# Patient Record
Sex: Female | Born: 1960 | Race: White | Hispanic: No | Marital: Married | Smoking: Former smoker
Health system: Southern US, Community
[De-identification: ages and names within clinical notes are randomized; demographics above are authoritative.]

## PROBLEM LIST (undated history)

## (undated) DIAGNOSIS — K8020 Calculus of gallbladder without cholecystitis without obstruction: Secondary | ICD-10-CM

## (undated) DIAGNOSIS — K219 Gastro-esophageal reflux disease without esophagitis: Secondary | ICD-10-CM

## (undated) HISTORY — PX: TUBAL LIGATION: SHX77

## (undated) HISTORY — PX: BREAST SURGERY: SHX581

---

## 1997-09-13 ENCOUNTER — Emergency Department (HOSPITAL_COMMUNITY): Admission: EM | Admit: 1997-09-13 | Discharge: 1997-09-13 | Payer: Self-pay | Admitting: Emergency Medicine

## 2005-01-26 ENCOUNTER — Emergency Department (HOSPITAL_COMMUNITY): Admission: EM | Admit: 2005-01-26 | Discharge: 2005-01-26 | Payer: Self-pay | Admitting: Emergency Medicine

## 2006-11-23 ENCOUNTER — Emergency Department (HOSPITAL_COMMUNITY): Admission: EM | Admit: 2006-11-23 | Discharge: 2006-11-23 | Payer: Self-pay | Admitting: Emergency Medicine

## 2008-08-31 ENCOUNTER — Encounter: Admission: RE | Admit: 2008-08-31 | Discharge: 2008-08-31 | Payer: Self-pay | Admitting: Obstetrics and Gynecology

## 2011-03-30 LAB — URINALYSIS, ROUTINE W REFLEX MICROSCOPIC: Hgb urine dipstick: NEGATIVE

## 2011-10-30 ENCOUNTER — Emergency Department (HOSPITAL_COMMUNITY): Payer: Self-pay

## 2011-10-30 ENCOUNTER — Emergency Department (HOSPITAL_COMMUNITY)
Admission: EM | Admit: 2011-10-30 | Discharge: 2011-10-30 | Disposition: A | Discharge status: Home / Self Care | Payer: Self-pay | Attending: Emergency Medicine | Admitting: Emergency Medicine

## 2011-10-30 ENCOUNTER — Encounter (HOSPITAL_COMMUNITY): Payer: Self-pay | Admitting: *Deleted

## 2011-10-30 DIAGNOSIS — K8020 Calculus of gallbladder without cholecystitis without obstruction: Secondary | ICD-10-CM | POA: Insufficient documentation

## 2011-10-30 DIAGNOSIS — F17200 Nicotine dependence, unspecified, uncomplicated: Secondary | ICD-10-CM | POA: Insufficient documentation

## 2011-10-30 DIAGNOSIS — R1011 Right upper quadrant pain: Secondary | ICD-10-CM | POA: Insufficient documentation

## 2011-10-30 DIAGNOSIS — K8050 Calculus of bile duct without cholangitis or cholecystitis without obstruction: Secondary | ICD-10-CM

## 2011-10-30 LAB — CBC
MCH: 32.7 pg (ref 26.0–34.0)
MCHC: 34.5 g/dL (ref 30.0–36.0)
MCV: 94.7 fL (ref 78.0–100.0)
Platelets: 205 10*3/uL (ref 150–400)
RDW: 14 % (ref 11.5–15.5)
WBC: 13.3 10*3/uL — ABNORMAL HIGH (ref 4.0–10.5)

## 2011-10-30 LAB — URINALYSIS, ROUTINE W REFLEX MICROSCOPIC
Bilirubin Urine: NEGATIVE
Glucose, UA: NEGATIVE mg/dL
Leukocytes, UA: NEGATIVE
Urobilinogen, UA: 0.2 mg/dL (ref 0.0–1.0)

## 2011-10-30 LAB — DIFFERENTIAL
Basophils Relative: 0 % (ref 0–1)
Eosinophils Absolute: 0.1 10*3/uL (ref 0.0–0.7)
Lymphs Abs: 2.5 10*3/uL (ref 0.7–4.0)
Monocytes Relative: 4 % (ref 3–12)
Neutrophils Relative %: 76 % (ref 43–77)

## 2011-10-30 LAB — COMPREHENSIVE METABOLIC PANEL
ALT: 22 U/L (ref 0–35)
AST: 19 U/L (ref 0–37)
Albumin: 4.1 g/dL (ref 3.5–5.2)
BUN: 10 mg/dL (ref 6–23)
Chloride: 103 mEq/L (ref 96–112)
Creatinine, Ser: 0.82 mg/dL (ref 0.50–1.10)
GFR calc Af Amer: >90 mL/min (ref >90)
Potassium: 4.1 mEq/L (ref 3.5–5.1)
Sodium: 137 mEq/L (ref 135–145)
Total Protein: 7.6 g/dL (ref 6.0–8.3)

## 2011-10-30 MED ORDER — HYDROMORPHONE HCL PF 1 MG/ML IJ SOLN
1.0000 mg | Freq: Once | INTRAMUSCULAR | Status: AC
  Administered 2011-10-30: 1 mg via INTRAVENOUS
  Filled 2011-10-30: qty 1

## 2011-10-30 MED ORDER — TRAMADOL HCL 50 MG PO TABS: 50.0000 mg | ORAL_TABLET | Freq: Four times a day (QID) | ORAL | Status: AC | PRN

## 2011-10-30 MED ORDER — SODIUM CHLORIDE 0.9 % IV SOLN
Freq: Once | INTRAVENOUS | Status: AC
  Administered 2011-10-30: 11:00:00 via INTRAVENOUS

## 2011-10-30 MED ORDER — OXYCODONE-ACETAMINOPHEN 5-325 MG PO TABS: 1.0000 | ORAL_TABLET | ORAL | Status: DC | PRN

## 2011-10-30 MED ORDER — ONDANSETRON HCL 4 MG/2ML IJ SOLN
4.0000 mg | Freq: Once | INTRAMUSCULAR | Status: AC
  Administered 2011-10-30: 4 mg via INTRAVENOUS
  Filled 2011-10-30: qty 2

## 2011-10-30 NOTE — ED Notes (Signed)
To ED for eval of RUQ pain with radiation to shoulder. Pt states she has had dull pains for the past yr but this pain is significantly increased. No vomiting.

## 2011-10-30 NOTE — ED Notes (Signed)
Patient states onset one year ago and intermittent having upper middle abdomen pain radiating to RUQ and middle back radiating up to upper back.  Pain worsening recently this AM after waking up abdomen distended moderate firm with nausea.  Did not eat this AM Pain 7-10/10 achy cramping full feeling. Airway intact bilateral equal chest rise and fall.

## 2011-10-30 NOTE — ED Notes (Signed)
Pt transported to ultrasound.

## 2011-10-30 NOTE — ED Provider Notes (Signed)
History     CSN: [id number]  Arrival date & time 10/30/11  4098   First MD Initiated Contact with Patient 10/30/11 1007      Chief Complaint  Patient presents with  . Abdominal Pain    (Consider location/radiation/quality/duration/timing/severity/associated sxs/prior treatment) Patient is a 51 y.o. female presenting with abdominal pain. The history is provided by the patient.  Abdominal Pain The primary symptoms of the illness include abdominal pain.  She has been having a vague tight feeling in the upper abdomen for the last year. This morning, she developed severe right upper pain which radiated around to the right flank and right scapular area. There is no associated nausea, vomiting, diarrhea. She has not had any dysuria. Nothing makes the pain better nothing makes it worse. She rates the pain at 10/10. She has not taken any medication to try and help her pain.  History reviewed. No pertinent past medical history.  History reviewed. No pertinent past surgical history.  History reviewed. No pertinent family history.  History  Substance Use Topics  . Smoking status: Current Everyday Smoker -- 2.0 packs/day    Types: Cigarettes  . Smokeless tobacco: Not on file  . Alcohol Use: No    OB History    Grav Para Term Preterm Abortions TAB SAB Ect Mult Living                  Review of Systems  Gastrointestinal: Positive for abdominal pain.  All other systems reviewed and are negative.    Allergies  Hydrocodone and Oxycodone  Home Medications   Current Outpatient Rx  Name Route Sig Dispense Refill  . IBUPROFEN 200 MG PO TABS Oral Take 800 mg by mouth every 6 (six) hours as needed. For pain (headache)    . NAPROXEN SODIUM 220 MG PO TABS Oral Take 440 mg by mouth daily as needed. For pain (back pain)      BP 137/82  Pulse 72  Temp(Src) 98.3 F (36.8 C) (Oral)  Resp 20  SpO2 97%  Physical Exam  Nursing note and vitals reviewed.  51 year old female is resting  comfortably and in no acute distress. Vital signs are normal. Oxygen saturation is 97% which is normal. Head is normocephalic and atraumatic. PERRLA, EOMI. There is no scleral icterus. Oropharynx is clear. Neck is nontender and supple. Back is nontender. There's no CVA tenderness. Lungs are clear without rales, wheezes, rhonchi. Heart has regular rate rhythm without murmur. Abdomen is soft, flat, with moderate to severe right upper quadrant tenderness with a positive Murphy sign. There is minimal epigastric tenderness. There is no right lower quadrant tenderness. Peristalsis is decreased but present. Extremities have full range of motion, no cyanosis or edema. Skin is warm and dry without rash. Neurologic: Mental status is normal, cranial nerves are intact, there are no focal motor or sensory deficits.  ED Course  Procedures (including critical care time)  Results for orders placed during the hospital encounter of 10/30/11  CBC      Component Value Range   WBC 13.3 (*) 4.0 - 10.5 (K/uL)   RBC 4.93  3.87 - 5.11 (MIL/uL)   Hemoglobin 16.1 (*) 12.0 - 15.0 (g/dL)   HCT 11.9 (*) 14.7 - 46.0 (%)   MCV 94.7  78.0 - 100.0 (fL)   MCH 32.7  26.0 - 34.0 (pg)   MCHC 34.5  30.0 - 36.0 (g/dL)   RDW 82.9  56.2 - 13.0 (%)   Platelets 205  150 -  400 (K/uL)  DIFFERENTIAL      Component Value Range   Neutrophils Relative 76  43 - 77 (%)   Lymphocytes Relative 19  12 - 46 (%)   Monocytes Relative 4  3 - 12 (%)   Eosinophils Relative 1  0 - 5 (%)   Basophils Relative 0  0 - 1 (%)   Neutro Abs 10.2 (*) 1.7 - 7.7 (K/uL)   Lymphs Abs 2.5  0.7 - 4.0 (K/uL)   Monocytes Absolute 0.5  0.1 - 1.0 (K/uL)   Eosinophils Absolute 0.1  0.0 - 0.7 (K/uL)   Basophils Absolute 0.0  0.0 - 0.1 (K/uL)  COMPREHENSIVE METABOLIC PANEL      Component Value Range   Sodium 137  135 - 145 (mEq/L)   Potassium 4.1  3.5 - 5.1 (mEq/L)   Chloride 103  96 - 112 (mEq/L)   CO2 22  19 - 32 (mEq/L)   Glucose, Bld 91  70 - 99 (mg/dL)   BUN  10  6 - 23 (mg/dL)   Creatinine, Ser 1.61  0.50 - 1.10 (mg/dL)   Calcium 9.8  8.4 - 09.6 (mg/dL)   Total Protein 7.6  6.0 - 8.3 (g/dL)   Albumin 4.1  3.5 - 5.2 (g/dL)   AST 19  0 - 37 (U/L)   ALT 22  0 - 35 (U/L)   Alkaline Phosphatase 97  39 - 117 (U/L)   Total Bilirubin 0.3  0.3 - 1.2 (mg/dL)   GFR calc non Af Amer 83 (*) >90 (mL/min)   GFR calc Af Amer >90  >90 (mL/min)  LIPASE, BLOOD      Component Value Range   Lipase 32  11 - 59 (U/L)  URINALYSIS, ROUTINE W REFLEX MICROSCOPIC      Component Value Range   Color, Urine YELLOW  YELLOW    APPearance CLEAR  CLEAR    Specific Gravity, Urine 1.008  1.005 - 1.030    pH 5.0  5.0 - 8.0    Glucose, UA NEGATIVE  NEGATIVE (mg/dL)   Hgb urine dipstick NEGATIVE  NEGATIVE    Bilirubin Urine NEGATIVE  NEGATIVE    Ketones, ur NEGATIVE  NEGATIVE (mg/dL)   Protein, ur NEGATIVE  NEGATIVE (mg/dL)   Urobilinogen, UA 0.2  0.0 - 1.0 (mg/dL)   Nitrite NEGATIVE  NEGATIVE    Leukocytes, UA NEGATIVE  NEGATIVE   POCT PREGNANCY, URINE      Component Value Range   Preg Test, Ur NEGATIVE  NEGATIVE    US Abdomen Complete  10/30/2011  *RADIOLOGY REPORT*  Clinical Data:  Abdominal pain.  COMPLETE ABDOMINAL ULTRASOUND  Comparison:  None  Findings:  Gallbladder:  Multiple echogenic gallstones in the gallbladder with associated acoustic shadowing.  The largest stone measures approximately 16 mm.  No gallbladder wall thickening or pericholecystic fluid.  Equivocal sonographic Murphy's sign.  Common bile duct:  Mildly dilated at 7.5 mm.  Liver:  The liver is sonographically unremarkable.  There is normal echogenicity without focal lesions or intrahepatic biliary dilatation.  IVC:  Normal caliber.  Pancreas:  Sonographically normal.  Spleen:  Normal size and echogenicity without focal lesions.  Right Kidney:  9.8 cm in length. Normal renal cortical thickness and echogenicity without focal lesions or hydronephrosis.  Left Kidney:  9.9 cm in length. Normal renal cortical  thickness and echogenicity without focal lesions or hydronephrosis.  Abdominal aorta:  Normal caliber.  IMPRESSION:  1.  Cholelithiasis without gallbladder wall thickening or pericholecystic  fluid.  Equivocal sonographic Murphy's sign. 2.  Mildly dilated common bile duct at 7.5 mm. 3.  The remainder of the examination is unremarkable.  Original Report Authenticated By: P. Loralie Champagne, M.D.      1. Biliary colic       MDM  Right upper quadrant pain suspicious for biliary colic. Ultrasound will be obtained. She will be given narcotics for pain control.  She got good relief of pain with hydromorphone. Ultrasound shows evidence of cholelithiasis but transaminases are normal. I feel she can safely go home at this point with referral to general surgery. She's a vice in a low-fat diet and a prescription was given for Percocet for pain.      Dione Booze, MD 10/30/11 [phone]

## 2011-10-30 NOTE — Discharge Instructions (Signed)
Your ultrasound shows that there are stones in her gallbladder and this is what was causing your pain. You need to make an appointment with the surgeon to have your gallbladder taken out. Otherwise, he will have additional episodes of pain like this. In the meantime, try to stay on a low-fat diet. Return to the emergency department if pain is not being adequately controlled at home.  Biliary Colic  Biliary colic is a steady or irregular pain in the upper abdomen. It is usually under the right side of the rib cage. It happens when gallstones interfere with the normal flow of bile from the gallbladder. Bile is a liquid that helps to digest fats. Bile is made in the liver and stored in the gallbladder. When you eat a meal, bile passes from the gallbladder through the cystic duct and the common bile duct into the small intestine. There, it mixes with partially digested food. If a gallstone blocks either of these ducts, the normal flow of bile is blocked. The muscle cells in the bile duct contract forcefully to try to move the stone. This causes the pain of biliary colic.  SYMPTOMS   A person with biliary colic usually complains of pain in the upper abdomen. This pain can be:   In the center of the upper abdomen just below the breastbone.   In the upper-right part of the abdomen, near the gallbladder and liver.   Spread back toward the right shoulder blade.   Nausea and vomiting.   The pain usually occurs after eating.   Biliary colic is usually triggered by the digestive system's demand for bile. The demand for bile is high after fatty meals. Symptoms can also occur when a person who has been fasting suddenly eats a very large meal. Most episodes of biliary colic pass after 1 to 5 hours. After the most intense pain passes, your abdomen may continue to ache mildly for about 24 hours.  DIAGNOSIS  After you describe your symptoms, your caregiver will perform a physical exam. He or she will pay attention  to the upper right portion of your belly (abdomen). This is the area of your liver and gallbladder. An ultrasound will help your caregiver look for gallstones. Specialized scans of the gallbladder may also be done. Blood tests may be done, especially if you have fever or if your pain persists. PREVENTION  Biliary colic can be prevented by controlling the risk factors for gallstones. Some of these risk factors, such as heredity, increasing age, and pregnancy are a normal part of life. Obesity and a high-fat diet are risk factors you can change through a healthy lifestyle. Women going through menopause who take hormone replacement therapy (estrogen) are also more likely to develop biliary colic. TREATMENT   Pain medication may be prescribed.   You may be encouraged to eat a fat-free diet.   If the first episode of biliary colic is severe, or episodes of colic keep retuning, surgery to remove the gallbladder (cholecystectomy) is usually recommended. This procedure can be done through small incisions using an instrument called a laparoscope. The procedure often requires a brief stay in the hospital. Some people can leave the hospital the same day. It is the most widely used treatment in people troubled by painful gallstones. It is effective and safe, with no complications in more than 90% of cases.   If surgery cannot be done, medication that dissolves gallstones may be used. This medication is expensive and can take months or years  to work. Only small stones will dissolve.   Rarely, medication to dissolve gallstones is combined with a procedure called shock-wave lithotripsy. This procedure uses carefully aimed shock waves to break up gallstones. In many people treated with this procedure, gallstones form again within a few years.  PROGNOSIS  If gallstones block your cystic duct or common bile duct, you are at risk for repeated episodes of biliary colic. There is also a 25% chance that you will develop a  gallbladder infection(acute cholecystitis), or some other complication of gallstones within 10 to 20 years. If you have surgery, schedule it at a time that is convenient for you and at a time when you are not sick. HOME CARE INSTRUCTIONS   Drink plenty of clear fluids.   Avoid fatty, greasy or fried foods, or any foods that make your pain worse.   Take medications as directed.  SEEK MEDICAL CARE IF:   You develop a fever over 100.5 F (38.1 C).   Your pain gets worse over time.   You develop nausea that prevents you from eating and drinking.   You develop vomiting.  SEEK IMMEDIATE MEDICAL CARE IF:   You have continuous or severe belly (abdominal) pain which is not relieved with medications.   You develop nausea and vomiting which is not relieved with medications.   You have symptoms of biliary colic and you suddenly develop a fever and shaking chills. This may signal cholecystitis. Call your caregiver immediately.   You develop a yellow color to your skin or the white part of your eyes (jaundice).  Document Released: 10/30/2005 Document Revised: 05/18/2011 Document Reviewed: 01/09/2008 [facility] Patient Information 2012 [place], [place].  Acetaminophen; Oxycodone tablets What is this medicine? ACETAMINOPHEN; OXYCODONE (a set a MEE noe fen; ox i KOE done) is a pain reliever. It is used to treat mild to moderate pain. This medicine may be used for other purposes; ask your health care provider or pharmacist if you have questions. What should I tell my health care provider before I take this medicine? They need to know if you have any of these conditions: -brain tumor -Crohn's disease, inflammatory bowel disease, or ulcerative colitis -drink more than 3 alcohol containing drinks per day -drug abuse or addiction -head injury -heart or circulation problems -kidney disease or problems going to the bathroom -liver disease -lung disease, asthma, or breathing problems -an unusual or  allergic reaction to acetaminophen, oxycodone, other opioid analgesics, other medicines, foods, dyes, or preservatives -pregnant or trying to get pregnant -breast-feeding How should I use this medicine? Take this medicine by mouth with a full glass of water. Follow the directions on the prescription label. Take your medicine at regular intervals. Do not take your medicine more often than directed. Talk to your pediatrician regarding the use of this medicine in children. Special care may be needed. Patients over 23 years old may have a stronger reaction and need a smaller dose. Overdosage: If you think you have taken too much of this medicine contact a poison control center or emergency room at once. NOTE: This medicine is only for you. Do not share this medicine with others. What if I miss a dose? If you miss a dose, take it as soon as you can. If it is almost time for your next dose, take only that dose. Do not take double or extra doses. What may interact with this medicine? -alcohol or medicines that contain alcohol -antihistamines -barbiturates like amobarbital, butalbital, butabarbital, methohexital, pentobarbital, phenobarbital, thiopental, and  secobarbital -benztropine -drugs for bladder problems like solifenacin, trospium, oxybutynin, tolterodine, hyoscyamine, and methscopolamine -drugs for breathing problems like ipratropium and tiotropium -drugs for certain stomach or intestine problems like propantheline, homatropine methylbromide, glycopyrrolate, atropine, belladonna, and dicyclomine -general anesthetics like etomidate, ketamine, nitrous oxide, propofol, desflurane, enflurane, halothane, isoflurane, and sevoflurane -medicines for depression, anxiety, or psychotic disturbances -medicines for pain like codeine, morphine, pentazocine, buprenorphine, butorphanol, nalbuphine, tramadol, and propoxyphene -medicines for sleep -muscle relaxants -naltrexone -phenothiazines like  perphenazine, thioridazine, chlorpromazine, mesoridazine, fluphenazine, prochlorperazine, promazine, and trifluoperazine -scopolamine -trihexyphenidyl This list may not describe all possible interactions. Give your health care provider a list of all the medicines, herbs, non-prescription drugs, or dietary supplements you use. Also tell them if you smoke, drink alcohol, or use illegal drugs. Some items may interact with your medicine. What should I watch for while using this medicine? Tell your doctor or health care professional if your pain does not go away, if it gets worse, or if you have new or a different type of pain. You may develop tolerance to the medicine. Tolerance means that you will need a higher dose of the medication for pain relief. Tolerance is normal and is expected if you take this medicine for a long time. Do not suddenly stop taking your medicine because you may develop a severe reaction. Your body becomes used to the medicine. This does NOT mean you are addicted. Addiction is a behavior related to getting and using a drug for a nonmedical reason. If you have pain, you have a medical reason to take pain medicine. Your doctor will tell you how much medicine to take. If your doctor wants you to stop the medicine, the dose will be slowly lowered over time to avoid any side effects. You may get drowsy or dizzy. Do not drive, use machinery, or do anything that needs mental alertness until you know how this medicine affects you. Do not stand or sit up quickly, especially if you are an older patient. This reduces the risk of dizzy or fainting spells. Alcohol may interfere with the effect of this medicine. Avoid alcoholic drinks. The medicine will cause constipation. Try to have a bowel movement at least every 2 to 3 days. If you do not have a bowel movement for 3 days, call your doctor or health care professional. Do not take Tylenol (acetaminophen) or medicines that have acetaminophen with  this medicine. Too much acetaminophen can be very dangerous. Many nonprescription medicines contain acetaminophen. Always read the labels carefully to avoid taking more acetaminophen. What side effects may I notice from receiving this medicine? Side effects that you should report to your doctor or health care professional as soon as possible: -allergic reactions like skin rash, itching or hives, swelling of the face, lips, or tongue -breathing difficulties, wheezing -confusion -light headedness or fainting spells -severe stomach pain -yellowing of the skin or the whites of the eyes Side effects that usually do not require medical attention (report to your doctor or health care professional if they continue or are bothersome): -dizziness -drowsiness -nausea -vomiting This list may not describe all possible side effects. Call your doctor for medical advice about side effects. You may report side effects to FDA at 1-800-FDA-1088. Where should I keep my medicine? Keep out of the reach of children. This medicine can be abused. Keep your medicine in a safe place to protect it from theft. Do not share this medicine with anyone. Selling or giving away this medicine is dangerous and against the  law. Store at room temperature between 20 and 25 degrees C (68 and 77 degrees F). Keep container tightly closed. Protect from light. Flush any unused medicines down the toilet. Do not use the medicine after the expiration date. NOTE: This sheet is a summary. It may not cover all possible information. If you have questions about this medicine, talk to your doctor, pharmacist, or health care provider.  2012, [employer]. (04/27/2008 10:01:21 AM)

## 2011-11-09 ENCOUNTER — Ambulatory Visit (INDEPENDENT_AMBULATORY_CARE_PROVIDER_SITE_OTHER): Payer: Self-pay | Admitting: Surgery

## 2013-05-02 ENCOUNTER — Ambulatory Visit (INDEPENDENT_AMBULATORY_CARE_PROVIDER_SITE_OTHER): Payer: Self-pay | Admitting: General Surgery

## 2013-05-02 ENCOUNTER — Ambulatory Visit (INDEPENDENT_AMBULATORY_CARE_PROVIDER_SITE_OTHER): Payer: Self-pay | Admitting: Surgery

## 2013-05-06 ENCOUNTER — Encounter (INDEPENDENT_AMBULATORY_CARE_PROVIDER_SITE_OTHER): Payer: Self-pay | Admitting: General Surgery

## 2013-05-06 ENCOUNTER — Ambulatory Visit (INDEPENDENT_AMBULATORY_CARE_PROVIDER_SITE_OTHER): Payer: BC Managed Care – PPO | Admitting: General Surgery

## 2013-05-06 VITALS — BP 120/72 | HR 100 | Temp 98.2°F | Resp 14 | Ht 70.0 in | Wt 191.0 lb

## 2013-05-06 DIAGNOSIS — K8020 Calculus of gallbladder without cholecystitis without obstruction: Secondary | ICD-10-CM

## 2013-05-06 NOTE — Progress Notes (Signed)
Patient ID: April Pennington, female   DOB: 07-31-1960, 52 y.o.   MRN: [id number]  Chief Complaint  Patient presents with  . Cholelithiasis    HPI April Pennington is a 52 y.o. female.  The patient is a 52 year old female who presents today for evaluation of gallstones. The patient had one half year history of right upper quadrant pain. She presented to the ER for evaluation of the pain. She underwent ultrasound which revealed gallstones. Her LFTs were within normal limits there this time.  The patient states her pain is completely at this time a consistent right upper quadrant pain. She states that she has pain with without meals.Marland Kitchen  HPI  No past medical history on file.  No past surgical history on file.  No family history on file.  Social History History  Substance Use Topics  . Smoking status: Former Smoker -- 2.00 packs/day    Types: Cigarettes    Quit date: 02/11/2012  . Smokeless tobacco: Not on file  . Alcohol Use: No    Allergies  Allergen Reactions  . Hydrocodone Nausea And Vomiting  . Oxycodone Nausea And Vomiting    Current Outpatient Prescriptions  Medication Sig Dispense Refill  . aspirin-sod bicarb-citric acid (ALKA-SELTZER) 325 MG TBEF tablet Take 325 mg by mouth every 6 (six) hours as needed.      Marland Kitchen ibuprofen (ADVIL,MOTRIN) 200 MG tablet Take 800 mg by mouth every 6 (six) hours as needed. For pain (headache)       No current facility-administered medications for this visit.    Review of Systems Review of Systems  Constitutional: Negative.   HENT: Negative.   Respiratory: Negative.   Cardiovascular: Negative.   Gastrointestinal: Negative.   Hematological: Negative.   All other systems reviewed and are negative.    Blood pressure 120/72, pulse 100, temperature 98.2 F (36.8 C), temperature source Temporal, resp. rate 14, height 5\' 10"  (1.778 m), weight 191 lb (86.637 kg).  Physical Exam Physical Exam  Constitutional: She is oriented to person, place,  and time. She appears well-developed and well-nourished.  HENT:  Head: Normocephalic and atraumatic.  Eyes: Conjunctivae and EOM are normal. Pupils are equal, round, and reactive to light.  Neck: Normal range of motion. Neck supple.  Cardiovascular: Normal rate, regular rhythm and normal heart sounds.   Pulmonary/Chest: Effort normal and breath sounds normal.  Abdominal: Soft. Bowel sounds are normal. There is no tenderness. There is no rebound.  Musculoskeletal: Normal range of motion.  Neurological: She is alert and oriented to person, place, and time.  Skin: Skin is warm and dry.    Data Reviewed Ultrasound reveals gallstones  Assessment    52 year old female with symptomatic cholelithiasis     Plan    1. We'll proceed to the operating room for laparoscopic cholecystectomy with IOC 2. All risks and benefits were discussed with the patient to generally include: infection, bleeding, possible need for post op ERCP, damage to the bile ducts, and bile leak. Alternatives were offered and described.  All questions were answered and the patient voiced understanding of the procedure and wishes to proceed at this point with a laparoscopic cholecystectomy.         Marigene Ehlers., Callan Norden 05/06/2013, 3:58 PM

## 2013-05-07 ENCOUNTER — Telehealth (INDEPENDENT_AMBULATORY_CARE_PROVIDER_SITE_OTHER): Payer: Self-pay | Admitting: General Surgery

## 2013-05-07 NOTE — Telephone Encounter (Signed)
Surgery scheduling contacted patient about surgery, gave patient financial responsibility, patient will call back to schedule, face sheet placed in pending.

## 2013-05-19 ENCOUNTER — Encounter (HOSPITAL_COMMUNITY): Payer: Self-pay

## 2013-05-19 ENCOUNTER — Other Ambulatory Visit (HOSPITAL_COMMUNITY): Payer: Self-pay | Admitting: *Deleted

## 2013-05-19 ENCOUNTER — Encounter (HOSPITAL_COMMUNITY)
Admission: RE | Admit: 2013-05-19 | Discharge: 2013-05-19 | Disposition: A | Discharge status: Home / Self Care | Payer: BC Managed Care – PPO | Source: Ambulatory Visit | Attending: General Surgery | Admitting: General Surgery

## 2013-05-19 HISTORY — DX: Gastro-esophageal reflux disease without esophagitis: K21.9

## 2013-05-19 HISTORY — DX: Calculus of gallbladder without cholecystitis without obstruction: K80.20

## 2013-05-19 LAB — BASIC METABOLIC PANEL
BUN: 15 mg/dL (ref 6–23)
Calcium: 9.3 mg/dL (ref 8.4–10.5)
Chloride: 105 mEq/L (ref 96–112)
Creatinine, Ser: 0.79 mg/dL (ref 0.50–1.10)
GFR calc Af Amer: >90 mL/min (ref >90)
GFR calc non Af Amer: >90 mL/min (ref >90)
Glucose, Bld: 90 mg/dL (ref 70–99)
Potassium: 4.1 mEq/L (ref 3.5–5.1)

## 2013-05-19 LAB — CBC
HCT: 43.3 % (ref 36.0–46.0)
MCHC: 34.4 g/dL (ref 30.0–36.0)
Platelets: 225 10*3/uL (ref 150–400)
RDW: 13.4 % (ref 11.5–15.5)

## 2013-05-19 MED ORDER — CEFAZOLIN SODIUM-DEXTROSE 2-3 GM-% IV SOLR
2.0000 g | INTRAVENOUS | Status: AC
  Administered 2013-05-20: 2 g via INTRAVENOUS
  Filled 2013-05-19: qty 50

## 2013-05-19 MED ORDER — CHLORHEXIDINE GLUCONATE 4 % EX LIQD: 1.0000 "application " | Freq: Once | CUTANEOUS | Status: DC

## 2013-05-19 NOTE — Pre-Procedure Instructions (Signed)
Jalisia Puchalski  05/19/2013   Your procedure is scheduled on:  Tuesday, May 20, 2013 at 7:30 AM.   Report to [facility] Entrance "A" Admitting Office at 5:30 AM.   Call this number if you have problems the morning of surgery: [phone]   Remember:   Do not eat food or drink liquids after midnight tonight, 05/19/13.   Take these medicines the morning of surgery with A SIP OF WATER: none   Do not wear jewelry, make-up or nail polish.  Do not wear lotions, powders, or perfumes. You may wear deodorant.  Do not shave 48 hours prior to surgery.   Do not bring valuables to the hospital.  [facility] is not responsible                  for any belongings or valuables.               Contacts, dentures or bridgework may not be worn into surgery.  Leave suitcase in the car. After surgery it may be brought to your room.  For patients admitted to the hospital, discharge time is determined by your                treatment team.               Patients discharged the day of surgery will not be allowed to drive  home.  Name and phone number of your driver: Family/friend   Special Instructions: Shower using CHG 2 nights before surgery and the night before surgery.  If you shower the day of surgery use CHG.  Use special wash - you have one bottle of CHG for all showers.  You should use approximately 1/3 of the bottle for each shower.   Please read over the following fact sheets that you were given: Pain Booklet, Coughing and Deep Breathing and Surgical Site Infection Prevention

## 2013-05-20 ENCOUNTER — Encounter (HOSPITAL_COMMUNITY): Payer: BC Managed Care – PPO | Admitting: Anesthesiology

## 2013-05-20 ENCOUNTER — Ambulatory Visit (HOSPITAL_COMMUNITY)
Admission: RE | Admit: 2013-05-20 | Discharge: 2013-05-20 | Disposition: A | Discharge status: Home / Self Care | Payer: BC Managed Care – PPO | Source: Ambulatory Visit | Attending: General Surgery | Admitting: General Surgery

## 2013-05-20 ENCOUNTER — Encounter (HOSPITAL_COMMUNITY): Payer: Self-pay | Admitting: Certified Registered"

## 2013-05-20 ENCOUNTER — Ambulatory Visit (HOSPITAL_COMMUNITY): Payer: BC Managed Care – PPO | Admitting: Anesthesiology

## 2013-05-20 ENCOUNTER — Ambulatory Visit (HOSPITAL_COMMUNITY): Payer: BC Managed Care – PPO

## 2013-05-20 ENCOUNTER — Encounter (HOSPITAL_COMMUNITY)
Admission: RE | Disposition: A | Discharge status: Home / Self Care | Payer: Self-pay | Source: Ambulatory Visit | Attending: General Surgery

## 2013-05-20 DIAGNOSIS — K219 Gastro-esophageal reflux disease without esophagitis: Secondary | ICD-10-CM | POA: Insufficient documentation

## 2013-05-20 DIAGNOSIS — K8010 Calculus of gallbladder with chronic cholecystitis without obstruction: Secondary | ICD-10-CM

## 2013-05-20 DIAGNOSIS — Z87891 Personal history of nicotine dependence: Secondary | ICD-10-CM | POA: Insufficient documentation

## 2013-05-20 HISTORY — PX: CHOLECYSTECTOMY: SHX55

## 2013-05-20 SURGERY — LAPAROSCOPIC CHOLECYSTECTOMY WITH INTRAOPERATIVE CHOLANGIOGRAM
Anesthesia: General | Site: Abdomen

## 2013-05-20 MED ORDER — TRAMADOL HCL 50 MG PO TABS: 50.0000 mg | ORAL_TABLET | Freq: Four times a day (QID) | ORAL | Status: DC | PRN

## 2013-05-20 MED ORDER — CEFAZOLIN SODIUM-DEXTROSE 2-3 GM-% IV SOLR
INTRAVENOUS | Status: AC
  Filled 2013-05-20: qty 50

## 2013-05-20 MED ORDER — LIDOCAINE HCL (CARDIAC) 20 MG/ML IV SOLN
INTRAVENOUS | Status: DC | PRN
  Administered 2013-05-20: 80 mg via INTRAVENOUS

## 2013-05-20 MED ORDER — ONDANSETRON HCL 4 MG/2ML IJ SOLN
INTRAMUSCULAR | Status: DC | PRN
  Administered 2013-05-20: 4 mg via INTRAVENOUS

## 2013-05-20 MED ORDER — PROPOFOL 10 MG/ML IV BOLUS
INTRAVENOUS | Status: DC | PRN
  Administered 2013-05-20: 160 mg via INTRAVENOUS

## 2013-05-20 MED ORDER — PROMETHAZINE HCL 25 MG/ML IJ SOLN
6.2500 mg | INTRAMUSCULAR | Status: DC | PRN
  Administered 2013-05-20: 6.25 mg via INTRAVENOUS

## 2013-05-20 MED ORDER — BUPIVACAINE HCL 0.25 % IJ SOLN
INTRAMUSCULAR | Status: DC | PRN
  Administered 2013-05-20: 7 mL

## 2013-05-20 MED ORDER — IOHEXOL 300 MG/ML  SOLN
INTRAMUSCULAR | Status: DC | PRN
  Administered 2013-05-20: 7 mL via INTRAVENOUS

## 2013-05-20 MED ORDER — KETOROLAC TROMETHAMINE 30 MG/ML IJ SOLN
INTRAMUSCULAR | Status: AC
  Filled 2013-05-20: qty 1

## 2013-05-20 MED ORDER — 0.9 % SODIUM CHLORIDE (POUR BTL) OPTIME
TOPICAL | Status: DC | PRN
  Administered 2013-05-20: 1000 mL

## 2013-05-20 MED ORDER — HYDROMORPHONE HCL PF 1 MG/ML IJ SOLN
INTRAMUSCULAR | Status: AC
  Filled 2013-05-20: qty 1

## 2013-05-20 MED ORDER — BUPIVACAINE HCL (PF) 0.25 % IJ SOLN
INTRAMUSCULAR | Status: AC
  Filled 2013-05-20: qty 30

## 2013-05-20 MED ORDER — MIDAZOLAM HCL 5 MG/5ML IJ SOLN
INTRAMUSCULAR | Status: DC | PRN
  Administered 2013-05-20: 2 mg via INTRAVENOUS

## 2013-05-20 MED ORDER — KETOROLAC TROMETHAMINE 15 MG/ML IJ SOLN
15.0000 mg | Freq: Four times a day (QID) | INTRAMUSCULAR | Status: DC
  Administered 2013-05-20: 15 mg via INTRAVENOUS

## 2013-05-20 MED ORDER — GLYCOPYRROLATE 0.2 MG/ML IJ SOLN
INTRAMUSCULAR | Status: DC | PRN
  Administered 2013-05-20: 0.6 mg via INTRAVENOUS

## 2013-05-20 MED ORDER — PROMETHAZINE HCL 25 MG/ML IJ SOLN
INTRAMUSCULAR | Status: AC
  Filled 2013-05-20: qty 1

## 2013-05-20 MED ORDER — NEOSTIGMINE METHYLSULFATE 1 MG/ML IJ SOLN
INTRAMUSCULAR | Status: DC | PRN
  Administered 2013-05-20: 4 mg via INTRAVENOUS

## 2013-05-20 MED ORDER — FENTANYL CITRATE 0.05 MG/ML IJ SOLN
INTRAMUSCULAR | Status: DC | PRN
  Administered 2013-05-20: 50 ug via INTRAVENOUS
  Administered 2013-05-20: 100 ug via INTRAVENOUS
  Administered 2013-05-20: 50 ug via INTRAVENOUS

## 2013-05-20 MED ORDER — EPHEDRINE SULFATE 50 MG/ML IJ SOLN
INTRAMUSCULAR | Status: DC | PRN
  Administered 2013-05-20: 15 mg via INTRAVENOUS

## 2013-05-20 MED ORDER — LACTATED RINGERS IV SOLN
INTRAVENOUS | Status: DC | PRN
  Administered 2013-05-20: 07:00:00 via INTRAVENOUS

## 2013-05-20 MED ORDER — DEXAMETHASONE SODIUM PHOSPHATE 4 MG/ML IJ SOLN
INTRAMUSCULAR | Status: DC | PRN
  Administered 2013-05-20: 4 mg via INTRAVENOUS

## 2013-05-20 MED ORDER — ARTIFICIAL TEARS OP OINT
TOPICAL_OINTMENT | OPHTHALMIC | Status: DC | PRN
  Administered 2013-05-20: 1 via OPHTHALMIC

## 2013-05-20 MED ORDER — HYDROMORPHONE HCL PF 1 MG/ML IJ SOLN
0.2500 mg | INTRAMUSCULAR | Status: DC | PRN
  Administered 2013-05-20: 0.5 mg via INTRAVENOUS

## 2013-05-20 MED ORDER — SODIUM CHLORIDE 0.9 % IR SOLN
Status: DC | PRN
  Administered 2013-05-20: 1000 mL

## 2013-05-20 MED ORDER — SODIUM CHLORIDE 0.9 % IJ SOLN
INTRAMUSCULAR | Status: AC
  Filled 2013-05-20: qty 9

## 2013-05-20 MED ORDER — ROCURONIUM BROMIDE 100 MG/10ML IV SOLN
INTRAVENOUS | Status: DC | PRN
  Administered 2013-05-20: 50 mg via INTRAVENOUS

## 2013-05-20 SURGICAL SUPPLY — 49 items
APL SKNCLS STERI-STRIP NONHPOA (GAUZE/BANDAGES/DRESSINGS) ×1
APPLIER CLIP 5 13 M/L LIGAMAX5 (MISCELLANEOUS) ×2
APR CLP MED LRG 5 ANG JAW (MISCELLANEOUS) ×1
BAG SPEC RTRVL LRG 6X4 10 (ENDOMECHANICALS)
BENZOIN TINCTURE PRP APPL 2/3 (GAUZE/BANDAGES/DRESSINGS) ×2 IMPLANT
CANISTER SUCTION 2500CC (MISCELLANEOUS) ×2 IMPLANT
CHLORAPREP W/TINT 26ML (MISCELLANEOUS) ×2 IMPLANT
CLIP APPLIE 5 13 M/L LIGAMAX5 (MISCELLANEOUS) IMPLANT
CLIP LIGATING HEMO O LOK GREEN (MISCELLANEOUS) ×2 IMPLANT
COVER MAYO STAND STRL (DRAPES) ×2 IMPLANT
COVER SURGICAL LIGHT HANDLE (MISCELLANEOUS) ×2 IMPLANT
COVER TRANSDUCER ULTRASND (DRAPES) ×1 IMPLANT
DEVICE TROCAR PUNCTURE CLOSURE (ENDOMECHANICALS) ×2 IMPLANT
DRAPE C-ARM 42X72 X-RAY (DRAPES) ×2 IMPLANT
DRAPE UTILITY 15X26 W/TAPE STR (DRAPE) ×4 IMPLANT
ELECT REM PT RETURN 9FT ADLT (ELECTROSURGICAL) ×2
ELECTRODE REM PT RTRN 9FT ADLT (ELECTROSURGICAL) ×1 IMPLANT
GAUZE SPONGE 2X2 8PLY STRL LF (GAUZE/BANDAGES/DRESSINGS) ×1 IMPLANT
GLOVE BIO SURGEON STRL SZ7 (GLOVE) ×1 IMPLANT
GLOVE BIO SURGEON STRL SZ7.5 (GLOVE) ×3 IMPLANT
GLOVE BIOGEL PI IND STRL 7.0 (GLOVE) ×2 IMPLANT
GLOVE BIOGEL PI IND STRL 7.5 (GLOVE) IMPLANT
GLOVE BIOGEL PI INDICATOR 7.0 (GLOVE) ×2
GLOVE BIOGEL PI INDICATOR 7.5 (GLOVE) ×1
GOWN STRL NON-REIN LRG LVL3 (GOWN DISPOSABLE) ×5 IMPLANT
GOWN STRL REIN XL XLG (GOWN DISPOSABLE) ×2 IMPLANT
IV CATH 14GX2 1/4 (CATHETERS) ×2 IMPLANT
KIT BASIN OR (CUSTOM PROCEDURE TRAY) ×2 IMPLANT
KIT ROOM TURNOVER OR (KITS) ×2 IMPLANT
NDL INSUFFLATION 14GA 120MM (NEEDLE) ×1 IMPLANT
NEEDLE INSUFFLATION 14GA 120MM (NEEDLE) ×2 IMPLANT
NS IRRIG 1000ML POUR BTL (IV SOLUTION) ×2 IMPLANT
PAD ARMBOARD 7.5X6 YLW CONV (MISCELLANEOUS) ×4 IMPLANT
POUCH SPECIMEN RETRIEVAL 10MM (ENDOMECHANICALS) IMPLANT
SCISSORS LAP 5X35 DISP (ENDOMECHANICALS) ×2 IMPLANT
SET CHOLANGIOGRAPHY FRANKLIN (SET/KITS/TRAYS/PACK) ×2 IMPLANT
SET IRRIG TUBING LAPAROSCOPIC (IRRIGATION / IRRIGATOR) ×2 IMPLANT
SLEEVE ENDOPATH XCEL 5M (ENDOMECHANICALS) ×2 IMPLANT
SPECIMEN JAR SMALL (MISCELLANEOUS) ×2 IMPLANT
SPONGE GAUZE 2X2 STER 10/PKG (GAUZE/BANDAGES/DRESSINGS) ×1
STRIP CLOSURE SKIN 1/2X4 (GAUZE/BANDAGES/DRESSINGS) ×1 IMPLANT
SUT MNCRL AB 3-0 PS2 18 (SUTURE) ×2 IMPLANT
SUT VICRYL 0 UR6 27IN ABS (SUTURE) ×1 IMPLANT
TAPE CLOTH SURG 4X10 WHT LF (GAUZE/BANDAGES/DRESSINGS) ×1 IMPLANT
TOWEL OR 17X24 6PK STRL BLUE (TOWEL DISPOSABLE) ×2 IMPLANT
TOWEL OR 17X26 10 PK STRL BLUE (TOWEL DISPOSABLE) ×2 IMPLANT
TRAY LAPAROSCOPIC (CUSTOM PROCEDURE TRAY) ×2 IMPLANT
TROCAR XCEL NON-BLD 11X100MML (ENDOMECHANICALS) ×2 IMPLANT
TROCAR XCEL NON-BLD 5MMX100MML (ENDOMECHANICALS) ×2 IMPLANT

## 2013-05-20 NOTE — Anesthesia Procedure Notes (Signed)
Procedure Name: Intubation Date/Time: 05/20/2013 7:31 AM Performed by: De Nurse Pre-anesthesia Checklist: Patient identified, Emergency Drugs available, Suction available, Patient being monitored and Timeout performed Patient Re-evaluated:Patient Re-evaluated prior to inductionOxygen Delivery Method: Circle system utilized Preoxygenation: Pre-oxygenation with 100% oxygen Intubation Type: IV induction Ventilation: Mask ventilation without difficulty Laryngoscope Size: Mac and 3 Grade View: Grade I Tube type: Oral Tube size: 7.0 mm Number of attempts: 1 Airway Equipment and Method: Stylet Placement Confirmation: ETT inserted through vocal cords under direct vision,  positive ETCO2 and breath sounds checked- equal and bilateral Secured at: 22 cm Tube secured with: Tape Dental Injury: Teeth and Oropharynx as per pre-operative assessment

## 2013-05-20 NOTE — Progress Notes (Signed)
Report [id number] to lauren rn as caregiver

## 2013-05-20 NOTE — Anesthesia Postprocedure Evaluation (Signed)
  Anesthesia Post-op Note  Patient: April Pennington  Procedure(s) Performed: Procedure(s): LAPAROSCOPIC CHOLECYSTECTOMY WITH INTRAOPERATIVE CHOLANGIOGRAM (N/A)  Patient Location: PACU  Anesthesia Type:General  Level of Consciousness: awake  Airway and Oxygen Therapy: Patient Spontanous Breathing  Post-op Pain: mild  Post-op Assessment: Post-op Vital signs reviewed  Post-op Vital Signs: stable  Complications: No apparent anesthesia complications

## 2013-05-20 NOTE — Preoperative (Signed)
Beta Blockers   Reason not to administer Beta Blockers:Not Applicable 

## 2013-05-20 NOTE — Op Note (Signed)
Pre Operative Diagnosis: Biliary colic   Post Operative Diagnosis: same   Procedure: Lap chole with IOC   Surgeon: Dr. Axel Filler   Assistant: none   Anesthesia: GETA   EBL: <5cc   Complications: none   Counts: reported as correct x 2   Findings:Normal IOC  Indications for procedure: Pt is a 52 yo/ F  with RUQ pain and seen to have gallstones.   Details of the procedure: The patient was taken to the operating and placed in the supine position with bilateral SCDs in place. A time out was called and all facts were verified. A pneumoperitoneum was obtained via A Veress needle technique to a pressure of 14mm of mercury. A 5mm trochar was then placed in the right upper quadrant under visualization, and there were no injuries to any abdominal organs. A 11 mm port was then placed in the umbilical region after infiltrating with local anesthesia under direct visualization. A second epigastric port was placed under direct visualization. The gallbladder was identified and retracted, the peritoneum was then sharply dissected from the gallbladder and this dissection was carried down to Calot's triangle. The cystic duct was identified and stripped away circumferentially and seen going into the gallbladder 360, the critical angle was obtained. It was noted to be very dilated and large. A Cook catheter was used to perform an intraoperative cholangiogram. The cystic duct and common bile duct were seen free of filling defects. 2 clips were placed proximally one distally and the cystic duct transected. The cystic artery was identified and 2 clips placed proximally and one distally and transected. We then proceeded to remove the gallbladder off the hepatic fossa with Bovie cautery. A retrieval bag was then placed in the abdomen and gallbladder placed in the bag. The hepatic fossa was then reexamined and hemostasis was achieved with Bovie cautery and was excellent at this portion of the case. The subhepatic  fossa and perihepatic fossa was then irrigated until the effluent was clear. The 11 mm trocar fascia was reapproximated with the Endo Close #1 Vicryl x3. The pneumoperitoneum was evacuated and all trochars removed under direct visulalization. The skin was then closed with 4-0 Monocryl and the skin dressed with Steri-Strips, gauze, and tape. The patient was awaken from general anesthesia and taken to the recovery room in stable condition.

## 2013-05-20 NOTE — Anesthesia Preprocedure Evaluation (Addendum)
Anesthesia Evaluation  Patient identified by MRN, date of birth, ID band Patient awake    Reviewed: Allergy & Precautions, Patient's Chart, lab work & pertinent test results  Airway Mallampati: I TM Distance: >3 FB Neck ROM: Full    Dental  (+) Teeth Intact, Dental Advisory Given and Chipped,    Pulmonary former smoker,  breath sounds clear to auscultation        Cardiovascular Rhythm:Regular Rate:Normal     Neuro/Psych    GI/Hepatic GERD-  Medicated,  Endo/Other    Renal/GU      Musculoskeletal   Abdominal   Peds  Hematology   Anesthesia Other Findings   Reproductive/Obstetrics                          Anesthesia Physical Anesthesia Plan  ASA: I  Anesthesia Plan: General   Post-op Pain Management:    Induction: Intravenous  Airway Management Planned: Oral ETT  Additional Equipment:   Intra-op Plan:   Post-operative Plan: Extubation in OR  Informed Consent: I have reviewed the patients History and Physical, chart, labs and discussed the procedure including the risks, benefits and alternatives for the proposed anesthesia with the patient or authorized representative who has indicated his/her understanding and acceptance.   Dental advisory given  Plan Discussed with: CRNA and Surgeon  Anesthesia Plan Comments:         Anesthesia Quick Evaluation

## 2013-05-20 NOTE — Transfer of Care (Signed)
Immediate Anesthesia Transfer of Care Note  Patient: April Pennington  Procedure(s) Performed: Procedure(s): LAPAROSCOPIC CHOLECYSTECTOMY WITH INTRAOPERATIVE CHOLANGIOGRAM (N/A)  Patient Location: PACU  Anesthesia Type:General  Level of Consciousness: awake and oriented  Airway & Oxygen Therapy: Patient Spontanous Breathing and Patient connected to nasal cannula oxygen  Post-op Assessment: Report given to PACU RN  Post vital signs: Reviewed and stable  Complications: No apparent anesthesia complications

## 2013-05-20 NOTE — H&P (View-Only) (Signed)
Patient ID: April Pennington, female   DOB: 01/02/1961, 52 y.o.   MRN: [id number]  Chief Complaint  Patient presents with  . Cholelithiasis    HPI April Pennington is a 52 y.o. female.  The patient is a 52-year-old female who presents today for evaluation of gallstones. The patient had one half year history of right upper quadrant pain. She presented to the ER for evaluation of the pain. She underwent ultrasound which revealed gallstones. Her LFTs were within normal limits there this time.  The patient states her pain is completely at this time a consistent right upper quadrant pain. She states that she has pain with without meals..  HPI  No past medical history on file.  No past surgical history on file.  No family history on file.  Social History History  Substance Use Topics  . Smoking status: Former Smoker -- 2.00 packs/day    Types: Cigarettes    Quit date: 02/11/2012  . Smokeless tobacco: Not on file  . Alcohol Use: No    Allergies  Allergen Reactions  . Hydrocodone Nausea And Vomiting  . Oxycodone Nausea And Vomiting    Current Outpatient Prescriptions  Medication Sig Dispense Refill  . aspirin-sod bicarb-citric acid (ALKA-SELTZER) 325 MG TBEF tablet Take 325 mg by mouth every 6 (six) hours as needed.      . ibuprofen (ADVIL,MOTRIN) 200 MG tablet Take 800 mg by mouth every 6 (six) hours as needed. For pain (headache)       No current facility-administered medications for this visit.    Review of Systems Review of Systems  Constitutional: Negative.   HENT: Negative.   Respiratory: Negative.   Cardiovascular: Negative.   Gastrointestinal: Negative.   Hematological: Negative.   All other systems reviewed and are negative.    Blood pressure 120/72, pulse 100, temperature 98.2 F (36.8 C), temperature source Temporal, resp. rate 14, height 5' 10" (1.778 m), weight 191 lb (86.637 kg).  Physical Exam Physical Exam  Constitutional: She is oriented to person, place,  and time. She appears well-developed and well-nourished.  HENT:  Head: Normocephalic and atraumatic.  Eyes: Conjunctivae and EOM are normal. Pupils are equal, round, and reactive to light.  Neck: Normal range of motion. Neck supple.  Cardiovascular: Normal rate, regular rhythm and normal heart sounds.   Pulmonary/Chest: Effort normal and breath sounds normal.  Abdominal: Soft. Bowel sounds are normal. There is no tenderness. There is no rebound.  Musculoskeletal: Normal range of motion.  Neurological: She is alert and oriented to person, place, and time.  Skin: Skin is warm and dry.    Data Reviewed Ultrasound reveals gallstones  Assessment    52-year-old female with symptomatic cholelithiasis     Plan    1. We'll proceed to the operating room for laparoscopic cholecystectomy with IOC 2. All risks and benefits were discussed with the patient to generally include: infection, bleeding, possible need for post op ERCP, damage to the bile ducts, and bile leak. Alternatives were offered and described.  All questions were answered and the patient voiced understanding of the procedure and wishes to proceed at this point with a laparoscopic cholecystectomy.         Saahas Hidrogo Jr., Katharina Jehle 05/06/2013, 3:58 PM    

## 2013-05-20 NOTE — Interval H&P Note (Signed)
History and Physical Interval Note:  05/20/2013 7:21 AM  April Pennington  has presented today for surgery, with the diagnosis of gallstones  The various methods of treatment have been discussed with the patient and family. After consideration of risks, benefits and other options for treatment, the patient has consented to  Procedure(s): LAPAROSCOPIC CHOLECYSTECTOMY WITH INTRAOPERATIVE CHOLANGIOGRAM (N/A) as a surgical intervention .  The patient's history has been reviewed, patient examined, no change in status, stable for surgery.  I have reviewed the patient's chart and labs.  Questions were answered to the patient's satisfaction.     Marigene Ehlers., Jed Limerick

## 2013-05-22 ENCOUNTER — Encounter (HOSPITAL_COMMUNITY): Payer: Self-pay | Admitting: General Surgery

## 2013-06-20 ENCOUNTER — Encounter (INDEPENDENT_AMBULATORY_CARE_PROVIDER_SITE_OTHER): Payer: Self-pay | Admitting: General Surgery

## 2013-06-20 ENCOUNTER — Ambulatory Visit (INDEPENDENT_AMBULATORY_CARE_PROVIDER_SITE_OTHER): Payer: BC Managed Care – PPO | Admitting: General Surgery

## 2013-06-20 VITALS — BP 124/64 | HR 68 | Temp 98.3°F | Resp 18 | Ht 70.0 in | Wt 190.0 lb

## 2013-06-20 DIAGNOSIS — Z9889 Other specified postprocedural states: Secondary | ICD-10-CM

## 2013-06-20 NOTE — Progress Notes (Signed)
Patient ID: April Pennington, female   DOB: 03/02/1961, 53 y.o.   MRN: [id number] Post op course This is a 53 year old female status post laparoscopic cholecystectomy. The patient has been doing well postoperatively. She's had no further upper quadrant pain. April Pennington is stable tolerating normal diet and is not having bowel dysfunction.  On Exam: Wounds clean dry and intact  Pathology:   Chronic cholecystitis, cholelithiasis.  This was discussed with the patient.  Assessment and Plan 53 year old female status post laparoscopic cholecystectomy 1. We discussed that a low-fat diet. 2. Patient follow up as needed   Axel FillerArmando Brodin Gelpi, MD [facility] General & Minimally Invasive Surgery Trauma & Emergency Surgery

## 2016-08-16 DIAGNOSIS — M5137 Other intervertebral disc degeneration, lumbosacral region: Secondary | ICD-10-CM | POA: Diagnosis not present

## 2016-08-16 DIAGNOSIS — M9901 Segmental and somatic dysfunction of cervical region: Secondary | ICD-10-CM | POA: Diagnosis not present

## 2016-08-16 DIAGNOSIS — M4802 Spinal stenosis, cervical region: Secondary | ICD-10-CM | POA: Diagnosis not present

## 2016-08-16 DIAGNOSIS — M9902 Segmental and somatic dysfunction of thoracic region: Secondary | ICD-10-CM | POA: Diagnosis not present

## 2016-08-17 ENCOUNTER — Other Ambulatory Visit: Payer: Self-pay | Admitting: Obstetrics & Gynecology

## 2016-08-17 DIAGNOSIS — Z6829 Body mass index (BMI) 29.0-29.9, adult: Secondary | ICD-10-CM | POA: Diagnosis not present

## 2016-08-17 DIAGNOSIS — Z01419 Encounter for gynecological examination (general) (routine) without abnormal findings: Secondary | ICD-10-CM | POA: Diagnosis not present

## 2016-08-17 DIAGNOSIS — Z124 Encounter for screening for malignant neoplasm of cervix: Secondary | ICD-10-CM | POA: Diagnosis not present

## 2016-08-17 DIAGNOSIS — Z1231 Encounter for screening mammogram for malignant neoplasm of breast: Secondary | ICD-10-CM | POA: Diagnosis not present

## 2016-08-18 ENCOUNTER — Other Ambulatory Visit: Payer: Self-pay | Admitting: Obstetrics & Gynecology

## 2016-08-18 DIAGNOSIS — M9901 Segmental and somatic dysfunction of cervical region: Secondary | ICD-10-CM | POA: Diagnosis not present

## 2016-08-18 DIAGNOSIS — R928 Other abnormal and inconclusive findings on diagnostic imaging of breast: Secondary | ICD-10-CM

## 2016-08-18 DIAGNOSIS — M4802 Spinal stenosis, cervical region: Secondary | ICD-10-CM | POA: Diagnosis not present

## 2016-08-18 DIAGNOSIS — M9902 Segmental and somatic dysfunction of thoracic region: Secondary | ICD-10-CM | POA: Diagnosis not present

## 2016-08-18 DIAGNOSIS — M5137 Other intervertebral disc degeneration, lumbosacral region: Secondary | ICD-10-CM | POA: Diagnosis not present

## 2016-08-18 LAB — CYTOLOGY - PAP

## 2016-08-23 DIAGNOSIS — M4802 Spinal stenosis, cervical region: Secondary | ICD-10-CM | POA: Diagnosis not present

## 2016-08-23 DIAGNOSIS — M9902 Segmental and somatic dysfunction of thoracic region: Secondary | ICD-10-CM | POA: Diagnosis not present

## 2016-08-23 DIAGNOSIS — M9901 Segmental and somatic dysfunction of cervical region: Secondary | ICD-10-CM | POA: Diagnosis not present

## 2016-08-23 DIAGNOSIS — M5137 Other intervertebral disc degeneration, lumbosacral region: Secondary | ICD-10-CM | POA: Diagnosis not present

## 2016-08-24 ENCOUNTER — Ambulatory Visit
Admission: RE | Admit: 2016-08-24 | Discharge: 2016-08-24 | Disposition: A | Discharge status: Home / Self Care | Payer: BLUE CROSS/BLUE SHIELD | Source: Ambulatory Visit | Attending: Obstetrics & Gynecology | Admitting: Obstetrics & Gynecology

## 2016-08-24 DIAGNOSIS — R928 Other abnormal and inconclusive findings on diagnostic imaging of breast: Secondary | ICD-10-CM

## 2016-08-24 DIAGNOSIS — N6489 Other specified disorders of breast: Secondary | ICD-10-CM | POA: Diagnosis not present

## 2016-08-25 DIAGNOSIS — M9901 Segmental and somatic dysfunction of cervical region: Secondary | ICD-10-CM | POA: Diagnosis not present

## 2016-08-25 DIAGNOSIS — M9902 Segmental and somatic dysfunction of thoracic region: Secondary | ICD-10-CM | POA: Diagnosis not present

## 2016-08-25 DIAGNOSIS — M5137 Other intervertebral disc degeneration, lumbosacral region: Secondary | ICD-10-CM | POA: Diagnosis not present

## 2016-08-25 DIAGNOSIS — M4802 Spinal stenosis, cervical region: Secondary | ICD-10-CM | POA: Diagnosis not present

## 2016-09-07 DIAGNOSIS — N8500 Endometrial hyperplasia, unspecified: Secondary | ICD-10-CM | POA: Diagnosis not present

## 2016-09-07 DIAGNOSIS — Z6828 Body mass index (BMI) 28.0-28.9, adult: Secondary | ICD-10-CM | POA: Diagnosis not present

## 2016-09-21 ENCOUNTER — Ambulatory Visit (INDEPENDENT_AMBULATORY_CARE_PROVIDER_SITE_OTHER): Payer: BLUE CROSS/BLUE SHIELD | Admitting: Physician Assistant

## 2016-09-21 ENCOUNTER — Encounter: Payer: Self-pay | Admitting: Physician Assistant

## 2016-09-21 VITALS — BP 118/78 | HR 70 | Temp 97.8°F | Resp 16 | Wt 201.4 lb

## 2016-09-21 DIAGNOSIS — E039 Hypothyroidism, unspecified: Secondary | ICD-10-CM

## 2016-09-21 DIAGNOSIS — Z1211 Encounter for screening for malignant neoplasm of colon: Secondary | ICD-10-CM

## 2016-09-21 DIAGNOSIS — Z7689 Persons encountering health services in other specified circumstances: Secondary | ICD-10-CM | POA: Diagnosis not present

## 2016-09-21 DIAGNOSIS — Z23 Encounter for immunization: Secondary | ICD-10-CM

## 2016-09-21 DIAGNOSIS — E785 Hyperlipidemia, unspecified: Secondary | ICD-10-CM | POA: Insufficient documentation

## 2016-09-21 DIAGNOSIS — Z1212 Encounter for screening for malignant neoplasm of rectum: Secondary | ICD-10-CM

## 2016-09-21 MED ORDER — SYNTHROID 25 MCG PO TABS: 25.0000 ug | ORAL_TABLET | Freq: Every day | ORAL | 1 refills | Status: DC

## 2016-09-21 NOTE — Progress Notes (Addendum)
Patient ID: April Pennington MRN: [id number], DOB: [birth year], 56 y.o. Date of Encounter: @  Chief Complaint:  Chief Complaint  Patient presents with  . Hypothyroidism    HPI: 56 y.o. year old female  presents as a New Patient to Establish Care.   She states that she recently saw Tomi Bamberger for about 6 - 8 months.   Says that prior to seeing her, she had not seen any type of medical provider in many many years. Says that she also recently saw gynecology and had breast exam mammogram pelvic exam Pap smear but prior to that had gone 26 years with no GYN evaluation.!  Says during the time that she was seeing Tomi Bamberger--- she started her on thyroid medication and Lipitor.  Sas that the thyroid medicine was started at ,  then went down to , then down to .  Says that she never had follow-up lab work on the 25 dose.  However says that she has been out of that medicine for over a month.  Says that Darl Pikes had started her on Lipitor 20 mg.  However patient stopped taking the Lipitor and has been off of that for 2 or 3 months.  Says she is working on her eating habits and was concerned about effects on liver.  Says that Darl Pikes also had noted that she had signs of fatty liver.  Says that she was eating a lot of fried foods and is doing a lot better with her diet.  Today I discussed other preventative care.  She has had no colorectal cancer screening.  Discussed Colonoscopy but she defers this. Says that she had a friend die from that. Is agreeable to do cologuard.  Today I discussed immunizations. Says that she has been getting her flu shots and has had 2 pneumonia shots. However has had no recent tetanus vaccine and this is not up-to-date and she is agreeable to get this today.  No other concerns to address today.  Addendum Added 10/23/2016: ---Cologuard--- Negative----------------  Past Medical History:  Diagnosis Date  . Gallstones   . GERD (gastroesophageal  reflux disease)    uses Alka seltzer     Home Meds: Outpatient Medications Prior to Visit  Medication Sig Dispense Refill  . aspirin-sod bicarb-citric acid (ALKA-SELTZER) 325 MG TBEF tablet Take 325 mg by mouth every 6 (six) hours as needed.    Marland Kitchen ibuprofen (ADVIL,MOTRIN) 200 MG tablet Take 800 mg by mouth every 6 (six) hours as needed. For pain (headache)    . traMADol (ULTRAM) 50 MG tablet Take 1 tablet (50 mg total) by mouth every 6 (six) hours as needed. 30 tablet 0   No facility-administered medications prior to visit.     Allergies:  Allergies  Allergen Reactions  . Hydrocodone Nausea And Vomiting  . Oxycodone Nausea And Vomiting  . Vicodin [Hydrocodone-Acetaminophen] Nausea And Vomiting    Social History   Social History  . Marital status: Married    Spouse name: N/A  . Number of children: N/A  . Years of education: N/A   Occupational History  . Not on file.   Social History Main Topics  . Smoking status: Former Smoker    Packs/day: 2.00    Types: Cigarettes    Quit date: 02/11/2012  . Smokeless tobacco: Never Used  . Alcohol use No  . Drug use: No  . Sexual activity: Not on file   Other Topics Concern  . Not on file   Social  History Narrative  . No narrative on file    No family history on file.   Review of Systems:  See HPI for pertinent ROS. All other ROS negative.    Physical Exam: Blood pressure 118/78, pulse 70, temperature 97.8 F (36.6 C), temperature source Oral, resp. rate 16, weight 201 lb 6.4 oz (91.4 kg), SpO2 97 %., Body mass index is 28.9 kg/m. General: WNWD WF. Appears in no acute distress. Neck: Supple. No thyromegaly. No lymphadenopathy. Lungs: Clear bilaterally to auscultation without wheezes, rales, or rhonchi. Breathing is unlabored. Heart: RRR with S1 S2. No murmurs, rubs, or gallops. Abdomen: Soft, non-tender, non-distended with normoactive bowel sounds. No hepatomegaly. No rebound/guarding. No obvious abdominal  masses. Musculoskeletal:  Strength and tone normal for age. Extremities/Skin: Warm and dry. No LE edema.  Neuro: Alert and oriented X 3. Moves all extremities spontaneously. Gait is normal. CNII-XII grossly in tact. Psych:  Responds to questions appropriately with a normal affect.     ASSESSMENT AND PLAN:  56 y.o. year old female with   1. Encounter to establish care  2. Hypothyroidism, unspecified type She is going to restart the Synthroid 25 g daily for 6 weeks then come in for TSH. - SYNTHROID 25 MCG tablet; Take 1 tablet (25 mcg total) by mouth daily.  Dispense: 30 tablet; Refill: 1 - TSH; Future  3. Hyperlipidemia, unspecified hyperlipidemia type She will stay off statin continue her improved diet for 6 months then recheck lipid panel.  4. Screening for colorectal cancer --Cologuard---Given at [facility] 09/21/2016 Addendum Added 10/23/2016: ---Cologuard--- Negative----------------  Immunizations: Tdap---Given here 09/21/2016 She reports she has had 2 Pneumonia Vaccines  She will return in 6 weeks to check TSH. She will return in 6 months for office visit. Will come fasting to that visit so we can recheck lipid panel.  Follow-up sooner if needed.  Signed, [address] [place], [place], [facility] 09/21/2016 11:20 AM

## 2016-10-04 ENCOUNTER — Ambulatory Visit: Payer: BLUE CROSS/BLUE SHIELD | Admitting: Physician Assistant

## 2016-10-04 DIAGNOSIS — Z1211 Encounter for screening for malignant neoplasm of colon: Secondary | ICD-10-CM | POA: Diagnosis not present

## 2016-10-04 DIAGNOSIS — Z1212 Encounter for screening for malignant neoplasm of rectum: Secondary | ICD-10-CM | POA: Diagnosis not present

## 2016-10-11 LAB — COLOGUARD: Cologuard: NEGATIVE

## 2016-10-23 ENCOUNTER — Other Ambulatory Visit: Payer: Self-pay | Admitting: Physician Assistant

## 2016-10-23 DIAGNOSIS — E039 Hypothyroidism, unspecified: Secondary | ICD-10-CM

## 2016-10-23 NOTE — Telephone Encounter (Signed)
Refill appropriate 

## 2016-11-02 ENCOUNTER — Other Ambulatory Visit: Payer: BLUE CROSS/BLUE SHIELD

## 2016-11-02 DIAGNOSIS — E039 Hypothyroidism, unspecified: Secondary | ICD-10-CM | POA: Diagnosis not present

## 2016-11-02 LAB — TSH: TSH: 9.43 m[IU]/L — AB

## 2016-11-07 ENCOUNTER — Other Ambulatory Visit: Payer: Self-pay

## 2016-11-07 DIAGNOSIS — E039 Hypothyroidism, unspecified: Secondary | ICD-10-CM

## 2016-11-07 MED ORDER — LEVOTHYROXINE SODIUM 50 MCG PO TABS: 50.0000 ug | ORAL_TABLET | Freq: Every day | ORAL | 0 refills | Status: DC

## 2016-11-15 ENCOUNTER — Telehealth: Payer: Self-pay

## 2016-11-15 NOTE — Telephone Encounter (Signed)
Called pt to provided results from cologuard  Test which was negative LVM with negative results per dpr

## 2016-12-19 ENCOUNTER — Other Ambulatory Visit: Payer: Self-pay

## 2017-01-18 ENCOUNTER — Encounter: Payer: Self-pay | Admitting: Physician Assistant

## 2017-02-19 ENCOUNTER — Encounter: Payer: Self-pay | Admitting: Physician Assistant

## 2017-03-22 ENCOUNTER — Encounter: Payer: Self-pay | Admitting: Physician Assistant

## 2018-03-05 ENCOUNTER — Encounter: Payer: Self-pay | Admitting: "Endocrinology

## 2018-03-05 ENCOUNTER — Ambulatory Visit (INDEPENDENT_AMBULATORY_CARE_PROVIDER_SITE_OTHER): Payer: 59 | Admitting: "Endocrinology

## 2018-03-05 VITALS — BP 123/84 | HR 78 | Ht 68.5 in | Wt 211.0 lb

## 2018-03-05 DIAGNOSIS — E039 Hypothyroidism, unspecified: Secondary | ICD-10-CM

## 2018-03-05 MED ORDER — LEVOTHYROXINE SODIUM 75 MCG PO TABS: 75.0000 ug | ORAL_TABLET | Freq: Every day | ORAL | 2 refills | Status: DC

## 2018-03-05 MED ORDER — LEVOTHYROXINE SODIUM 25 MCG PO TABS: 25.0000 ug | ORAL_TABLET | Freq: Every day | ORAL | 3 refills | Status: DC

## 2018-03-05 NOTE — Progress Notes (Signed)
Endocrinology Consult Note                                         03/05/2018, 7:24 PM   April Pennington is a 57 y.o.-year-old female patient being seen in consultation for hypothyroidism referred by Assunta Found, MD.   Past Medical History:  Diagnosis Date  . Gallstones   . GERD (gastroesophageal reflux disease)    uses Alka seltzer   Past Surgical History:  Procedure Laterality Date  . BREAST SURGERY     cyst removed from right breast at age 7  . CHOLECYSTECTOMY N/A 05/20/2013   Procedure: LAPAROSCOPIC CHOLECYSTECTOMY WITH INTRAOPERATIVE CHOLANGIOGRAM;  Surgeon: Axel Filler, MD;  Location: [facility] OR;  Service: General;  Laterality: N/A;  . TUBAL LIGATION     Social History   Socioeconomic History  . Marital status: Married    Spouse name: Not on file  . Number of children: Not on file  . Years of education: Not on file  . Highest education level: Not on file  Occupational History  . Not on file  Social Needs  . Financial resource strain: Not on file  . Food insecurity:    Worry: Not on file    Inability: Not on file  . Transportation needs:    Medical: Not on file    Non-medical: Not on file  Tobacco Use  . Smoking status: Former Smoker    Packs/day: 2.00    Types: Cigarettes    Last attempt to quit: 02/11/2012    Years since quitting: 6.0  . Smokeless tobacco: Never Used  Substance and Sexual Activity  . Alcohol use: No  . Drug use: No  . Sexual activity: Not on file  Lifestyle  . Physical activity:    Days per week: Not on file    Minutes per session: Not on file  . Stress: Not on file  Relationships  . Social connections:    Talks on phone: Not on file    Gets together: Not on file    Attends religious service: Not on file    Active member of club or organization: Not on file    Attends meetings of clubs or organizations: Not on file    Relationship status: Not on file   Other Topics Concern  . Not on file  Social History Narrative  . Not on file   Outpatient Encounter Medications as of 03/05/2018  Medication Sig  . levothyroxine (SYNTHROID) 25 MCG tablet Take 1 tablet (25 mcg total) by mouth daily.  . [DISCONTINUED] aspirin-sod bicarb-citric acid (ALKA-SELTZER) 325 MG TBEF tablet Take 325 mg by mouth every 6 (six) hours as needed.  . [DISCONTINUED] ibuprofen (ADVIL,MOTRIN) 200 MG tablet Take 800 mg by mouth every 6 (six) hours as needed. For pain (headache)  . [DISCONTINUED] levothyroxine (SYNTHROID) 25 MCG tablet daily.  . [DISCONTINUED] levothyroxine (SYNTHROID) 75 MCG tablet Take 1 tablet (75 mcg total) by mouth daily  before breakfast.  . [DISCONTINUED] levothyroxine (SYNTHROID, LEVOTHROID) 50 MCG tablet Take 1 tablet (50 mcg total) by mouth daily.   No facility-administered encounter medications on file as of 03/05/2018.    ALLERGIES: Allergies  Allergen Reactions  . Hydrocodone Nausea And Vomiting  . Oxycodone Nausea And Vomiting  . Vicodin [Hydrocodone-Acetaminophen] Nausea And Vomiting   VACCINATION STATUS: Immunization History  Administered Date(s) Administered  . Tdap 09/21/2016     HPI    April Pennington  is a patient with the above medical history. she was diagnosed  with hypothyroidism at approximate age of 57 years with lab work after presentation with symptoms and signs consistent with hypothyroidism.  she was given various doses of thyroxine over the years, currently on Armour thyroid 60 mg micrograms daily. she reports compliance to this medication: However patient continues to have fatigue, progressive weight gain, hair loss, brittle nails, sleepiness.  -These symptoms are progressively worsening per her report.  I reviewed patient's  thyroid tests:  Lab Results  Component Value Date   TSH 9.43 (H) 11/02/2016    Pt denies feeling nodules in neck, hoarseness, dysphagia/odynophagia, SOB with lying down.  She has + FH of  thyroid disorders in: Her mother who is taking thyroid hormone supplements. No FH of thyroid cancer.  No h/o radiation tx to head or neck. No recent use of iodine supplements.   ROS:  Constitutional: + weight gain/loss, + fatigue, + subjective  hypothermia Eyes: no blurry vision, no xerophthalmia ENT: no sore throat, no nodules palpated in throat, no dysphagia/odynophagia, no hoarseness Cardiovascular: no Chest Pain, no Shortness of Breath, no palpitations, no leg swelling Respiratory: no cough, no SOB Gastrointestinal: no Nausea/Vomiting/Diarhhea Musculoskeletal: no muscle/joint aches Skin: no rashes Neurological: no tremors, no numbness, no tingling, no dizziness Psychiatric: no depression, no anxiety   Physical Exam: BP 123/84   Pulse 78   Ht 5' 8.5" (1.74 m)   Wt 211 lb (95.7 kg)   BMI 31.62 kg/m  Wt Readings from Last 3 Encounters:  03/05/18 211 lb (95.7 kg)  09/21/16 201 lb 6.4 oz (91.4 kg)  06/20/13 190 lb (86.2 kg)    Constitutional:  + Obese for height, not in acute distress, normal state of mind Eyes: PERRLA, EOMI, no exophthalmos ENT: moist mucous membranes, + palpable thyromegaly, no cervical lymphadenopathy Cardiovascular: normal precordial activity, Regular Rate and Rhythm, no Murmur/Rubs/Gallops Respiratory:  adequate breathing efforts, no gross chest deformity, Clear to auscultation bilaterally Gastrointestinal: abdomen soft, Non -tender, No distension, Bowel Sounds present Musculoskeletal: no gross deformities, strength intact in all four extremities Skin:  + Dry , warm, no rashes Neurological: no tremor with outstretched hands, Deep tendon reflexes normal in all four extremities.   CMP ( most recent) CMP     Component Value Date/Time   NA 140 05/19/2013 1032   K 4.1 05/19/2013 1032   CL 105 05/19/2013 1032   CO2 26 05/19/2013 1032   GLUCOSE 90 05/19/2013 1032   BUN 15 05/19/2013 1032   CREATININE 0.79 05/19/2013 1032   CALCIUM 9.3 05/19/2013  1032   PROT 7.6 10/30/2011 1034   ALBUMIN 4.1 10/30/2011 1034   AST 19 10/30/2011 1034   ALT 22 10/30/2011 1034   ALKPHOS 97 10/30/2011 1034   BILITOT 0.3 10/30/2011 1034   GFRNONAA >90 05/19/2013 1032   GFRAA >90 05/19/2013 1032    Lab Results  Component Value Date   TSH 9.43 (H) 11/02/2016       ASSESSMENT: 1. Hypothyroidism  PLAN:    Patient with long-standing hypothyroidism, on Armour 60 mg daily therapy. Patient appears medically hypothyroid. On physical exam , patient  does   have a goiter with no compression symptoms. -We discussed about options of thyroid hormone replacement.  She is not getting enough thyroid hormone in the form of Armour Thyroid.  Besides, therapy with Armour makes it difficult to regulate her thyroid function test with blood test.  She is open to retry Synthroid in appropriate dose. -I advised her to discontinue Armour, start Synthroid at 75 mcg p.o. every morning. - We discussed about correct intake of levothyroxine, at fasting, with water, separated by at least 30 minutes from breakfast, and separated by more than 4 hours from calcium, iron, multivitamins, acid reflux medications (PPIs). -Patient is made aware of the fact that thyroid hormone replacement is needed for life, dose to be adjusted by periodic monitoring of thyroid function tests. - Will check thyroid tests before next visit: TSH, free T4 -Due to palpable thyromegaly, she is offered baseline thyroid ultrasound.      - Time spent with the patient: 45 minutes, of which >50% was spent in obtaining information about her symptoms, reviewing her previous labs, evaluations, and treatments, counseling her about her long-standing hypothyroidism, clinical goiter, and developing a plan to confirm the diagnosis and long term treatment as necessary.  Alfredia ClientJill D Wimberly participated in the discussions, expressed understanding, and voiced agreement with the above plans.  All questions were answered to her  satisfaction. she is encouraged to contact clinic should she have any questions or concerns prior to her return visit.  Return in about 8 weeks (around 04/30/2018) for Follow up with Pre-visit Labs, Thyroid / Neck Ultrasound.  Marquis LunchGebre Burrel Legrand, MD [facility] [address] [place], [place] [address] Phone: [phone]  Fax: [phone]   03/05/2018, 7:24 PM  This note was partially dictated with voice recognition software. Similar sounding words can be transcribed inadequately or may not  be corrected upon review.

## 2018-03-06 ENCOUNTER — Other Ambulatory Visit: Payer: Self-pay

## 2018-03-06 ENCOUNTER — Other Ambulatory Visit: Payer: Self-pay | Admitting: "Endocrinology

## 2018-03-06 MED ORDER — LEVOTHYROXINE SODIUM 75 MCG PO TABS: 75.0000 ug | ORAL_TABLET | Freq: Every day | ORAL | 2 refills | Status: DC

## 2018-03-06 NOTE — Progress Notes (Signed)
This encounter was created in error - please disregard.

## 2018-03-07 ENCOUNTER — Encounter: Payer: Self-pay | Admitting: "Endocrinology

## 2018-03-28 ENCOUNTER — Emergency Department (HOSPITAL_COMMUNITY)
Admission: EM | Admit: 2018-03-28 | Discharge: 2018-03-28 | Disposition: A | Discharge status: Home / Self Care | Payer: 59 | Attending: Emergency Medicine | Admitting: Emergency Medicine

## 2018-03-28 ENCOUNTER — Encounter (HOSPITAL_COMMUNITY): Payer: Self-pay | Admitting: Emergency Medicine

## 2018-03-28 ENCOUNTER — Other Ambulatory Visit: Payer: Self-pay

## 2018-03-28 ENCOUNTER — Emergency Department (HOSPITAL_COMMUNITY): Payer: 59

## 2018-03-28 DIAGNOSIS — Z9049 Acquired absence of other specified parts of digestive tract: Secondary | ICD-10-CM | POA: Diagnosis not present

## 2018-03-28 DIAGNOSIS — E039 Hypothyroidism, unspecified: Secondary | ICD-10-CM | POA: Insufficient documentation

## 2018-03-28 DIAGNOSIS — R1030 Lower abdominal pain, unspecified: Secondary | ICD-10-CM | POA: Diagnosis present

## 2018-03-28 DIAGNOSIS — K5792 Diverticulitis of intestine, part unspecified, without perforation or abscess without bleeding: Secondary | ICD-10-CM

## 2018-03-28 DIAGNOSIS — K5732 Diverticulitis of large intestine without perforation or abscess without bleeding: Secondary | ICD-10-CM | POA: Diagnosis not present

## 2018-03-28 DIAGNOSIS — Z79899 Other long term (current) drug therapy: Secondary | ICD-10-CM | POA: Diagnosis not present

## 2018-03-28 LAB — COMPREHENSIVE METABOLIC PANEL
ALK PHOS: 80 U/L (ref 38–126)
ALT: 37 U/L (ref 0–44)
ANION GAP: 8 (ref 5–15)
AST: 23 U/L (ref 15–41)
Albumin: 4.5 g/dL (ref 3.5–5.0)
BUN: 10 mg/dL (ref 6–20)
CALCIUM: 9.3 mg/dL (ref 8.9–10.3)
CHLORIDE: 103 mmol/L (ref 98–111)
CO2: 26 mmol/L (ref 22–32)
Creatinine, Ser: 0.85 mg/dL (ref 0.44–1.00)
GFR calc non Af Amer: >60 mL/min (ref >60)
Glucose, Bld: 100 mg/dL — ABNORMAL HIGH (ref 70–99)
Potassium: 3.8 mmol/L (ref 3.5–5.1)
Sodium: 137 mmol/L (ref 135–145)
Total Bilirubin: 0.9 mg/dL (ref 0.3–1.2)
Total Protein: 8.2 g/dL — ABNORMAL HIGH (ref 6.5–8.1)

## 2018-03-28 LAB — URINALYSIS, ROUTINE W REFLEX MICROSCOPIC
Bilirubin Urine: NEGATIVE
Glucose, UA: NEGATIVE mg/dL
Hgb urine dipstick: NEGATIVE
KETONES UR: NEGATIVE mg/dL
LEUKOCYTES UA: NEGATIVE
NITRITE: NEGATIVE
PH: 6 (ref 5.0–8.0)
Protein, ur: NEGATIVE mg/dL
SPECIFIC GRAVITY, URINE: 1.014 (ref 1.005–1.030)

## 2018-03-28 LAB — CBC
HEMATOCRIT: 50.5 % — AB (ref 36.0–46.0)
HEMOGLOBIN: 16.6 g/dL — AB (ref 12.0–15.0)
MCH: 31.8 pg (ref 26.0–34.0)
MCHC: 32.9 g/dL (ref 30.0–36.0)
MCV: 96.7 fL (ref 80.0–100.0)
NRBC: 0 % (ref 0.0–0.2)
Platelets: 277 10*3/uL (ref 150–400)
RBC: 5.22 MIL/uL — ABNORMAL HIGH (ref 3.87–5.11)
RDW: 13.2 % (ref 11.5–15.5)
WBC: 14.4 10*3/uL — AB (ref 4.0–10.5)

## 2018-03-28 LAB — LIPASE, BLOOD: LIPASE: 27 U/L (ref 11–51)

## 2018-03-28 MED ORDER — SODIUM CHLORIDE 0.9 % IV BOLUS
500.0000 mL | Freq: Once | INTRAVENOUS | Status: AC
  Administered 2018-03-28: 500 mL via INTRAVENOUS

## 2018-03-28 MED ORDER — CIPROFLOXACIN HCL 500 MG PO TABS: 500.0000 mg | ORAL_TABLET | Freq: Two times a day (BID) | ORAL | 0 refills | Status: DC

## 2018-03-28 MED ORDER — SODIUM CHLORIDE 0.9 % IV SOLN
INTRAVENOUS | Status: DC
  Administered 2018-03-28: 21:00:00 via INTRAVENOUS

## 2018-03-28 MED ORDER — CIPROFLOXACIN HCL 250 MG PO TABS
500.0000 mg | ORAL_TABLET | Freq: Two times a day (BID) | ORAL | Status: DC
  Administered 2018-03-28: 500 mg via ORAL
  Filled 2018-03-28: qty 2

## 2018-03-28 MED ORDER — IOPAMIDOL (ISOVUE-300) INJECTION 61%
100.0000 mL | Freq: Once | INTRAVENOUS | Status: AC | PRN
  Administered 2018-03-28: 100 mL via INTRAVENOUS

## 2018-03-28 MED ORDER — METRONIDAZOLE 500 MG PO TABS
500.0000 mg | ORAL_TABLET | Freq: Once | ORAL | Status: AC
  Administered 2018-03-28: 500 mg via ORAL
  Filled 2018-03-28: qty 1

## 2018-03-28 MED ORDER — METRONIDAZOLE 500 MG PO TABS: 500.0000 mg | ORAL_TABLET | Freq: Three times a day (TID) | ORAL | 0 refills | Status: AC

## 2018-03-28 NOTE — ED Notes (Signed)
Patient transported to CT 

## 2018-03-28 NOTE — ED Provider Notes (Signed)
[facility] EMERGENCY DEPARTMENT Provider Note   CSN: [id number] Arrival date & time: 03/28/18  1805     History   Chief Complaint Chief Complaint  Patient presents with  . Abdominal Pain    HPI April Pennington is a 57 y.o. female.  Patient with onset of suprapubic abdominal pain crampy in nature at 1 AM.  Preceded by 2 days of some low back discomfort.  Today started with diarrhea about 6 episodes no blood.  No nausea or vomiting no fevers no dysuria.  No prior history of similar pain.  Prior surgeries are significant for tubal ligation and cholecystectomy.     Past Medical History:  Diagnosis Date  . Gallstones   . GERD (gastroesophageal reflux disease)    uses Alka seltzer    Patient Active Problem List   Diagnosis Date Noted  . Hypothyroidism 09/21/2016  . Hyperlipidemia 09/21/2016    Past Surgical History:  Procedure Laterality Date  . BREAST SURGERY     cyst removed from right breast at age 78  . CHOLECYSTECTOMY N/A 05/20/2013   Procedure: LAPAROSCOPIC CHOLECYSTECTOMY WITH INTRAOPERATIVE CHOLANGIOGRAM;  Surgeon: Axel Filler, MD;  Location: [facility] OR;  Service: General;  Laterality: N/A;  . TUBAL LIGATION       OB History   None      Home Medications    Prior to Admission medications   Medication Sig Start Date End Date Taking? Authorizing Provider  levothyroxine (SYNTHROID, LEVOTHROID) 75 MCG tablet Take 1 tablet (75 mcg total) by mouth daily before breakfast. 03/06/18  Yes Nida, Denman George, MD  milk thistle 175 MG tablet Take 175 mg by mouth daily.   Yes [provider]    Family History No family history on file.  Social History Social History   Tobacco Use  . Smoking status: Never Smoker  . Smokeless tobacco: Never Used  Substance Use Topics  . Alcohol use: No    Frequency: Never  . Drug use: No     Allergies   Hydrocodone; Hydrocodone; Oxycodone; Vicodin [hydrocodone-acetaminophen]; and Vicodin  [hydrocodone-acetaminophen]   Review of Systems Review of Systems  Constitutional: Negative for fever.  HENT: Negative for congestion.   Eyes: Negative for redness.  Respiratory: Negative for shortness of breath.   Gastrointestinal: Positive for abdominal pain and diarrhea. Negative for blood in stool, nausea and vomiting.  Genitourinary: Negative for dysuria, hematuria, vaginal bleeding and vaginal discharge.  Musculoskeletal: Positive for back pain.  Skin: Negative for rash.  Neurological: Negative for syncope.  Hematological: Does not bruise/bleed easily.  Psychiatric/Behavioral: Negative for confusion.     Physical Exam Updated Vital Signs BP (!) 149/100   Pulse 88   Temp 98.3 F (36.8 C) (Oral)   Resp 16   Ht 1.753 m (5\' 9" )   Wt 95.3 kg   SpO2 95%   BMI 31.01 kg/m   Physical Exam  Constitutional: She is oriented to person, place, and time. She appears well-developed and well-nourished. No distress.  Eyes: Pupils are equal, round, and reactive to light. EOM are normal.  Neck: Normal range of motion. Neck supple.  Cardiovascular: Normal rate, regular rhythm and normal heart sounds.  Pulmonary/Chest: Effort normal and breath sounds normal.  Abdominal: Soft. Bowel sounds are normal. She exhibits no distension and no mass. There is no tenderness.  Musculoskeletal: Normal range of motion. She exhibits no edema.  Neurological: She is alert and oriented to person, place, and time. No cranial nerve deficit or sensory deficit. She  exhibits normal muscle tone. Coordination normal.  Skin: Skin is warm.  Nursing note and vitals reviewed.    ED Treatments / Results  Labs (all labs ordered are listed, but only abnormal results are displayed) Labs Reviewed  COMPREHENSIVE METABOLIC PANEL - Abnormal; Notable for the following components:      Result Value   Glucose, Bld 100 (*)    Total Protein 8.2 (*)    All other components within normal limits  CBC - Abnormal; Notable  for the following components:   WBC 14.4 (*)    RBC 5.22 (*)    Hemoglobin 16.6 (*)    HCT 50.5 (*)    All other components within normal limits  LIPASE, BLOOD  URINALYSIS, ROUTINE W REFLEX MICROSCOPIC    EKG None  Radiology No results found.  Procedures Procedures (including critical care time)  Medications Ordered in ED Medications  0.9 %  sodium chloride infusion (has no administration in time range)  sodium chloride 0.9 % bolus 500 mL (500 mLs Intravenous New Bag/Given 03/28/18 2040)     Initial Impression / Assessment and Plan / ED Course  I have reviewed the triage vital signs and the nursing notes.  Pertinent labs & imaging results that were available during my care of the patient were reviewed by me and considered in my medical decision making (see chart for details).     Abdominal pain concerning possibly for diverticulitis.  Patient has a leukocytosis otherwise labs are negative.  CT scan abdomen pelvis with contrast has been ordered and is pending.   CT scan is consistent with diverticulitis patient will be started on oral Cipro and Flagyl no complicating factors.  Patient given precautions will follow-up with her regular doctor.  Will return if not improving in 2 to 4 days will return earlier for any new or worse symptoms.   Final Clinical Impressions(s) / ED Diagnoses   Final diagnoses:  Lower abdominal pain    ED Discharge Orders    None       Vanetta Mulders, MD 03/28/18 2246

## 2018-03-28 NOTE — Discharge Instructions (Signed)
Take the Cipro and Flagyl as directed for the next 7 days.  Make an appointment follow-up with your doctor.  Today's work-up consistent with diverticulitis without any complicating factors.  You should be improving over the next 2 to 4 days.  If you get worse at all you need to get seen again.  Recommend a bland diet for a few days.  Work note provided.

## 2018-03-28 NOTE — ED Triage Notes (Signed)
Pt c/o of lower abdominal pain since 0100 this morning with worsening pain and diarrhea.

## 2018-04-01 ENCOUNTER — Ambulatory Visit (HOSPITAL_COMMUNITY)
Admission: RE | Admit: 2018-04-01 | Discharge: 2018-04-01 | Disposition: A | Discharge status: Home / Self Care | Payer: 59 | Source: Ambulatory Visit | Attending: "Endocrinology | Admitting: "Endocrinology

## 2018-04-01 DIAGNOSIS — E039 Hypothyroidism, unspecified: Secondary | ICD-10-CM | POA: Diagnosis present

## 2018-04-02 ENCOUNTER — Ambulatory Visit (HOSPITAL_COMMUNITY): Payer: Self-pay

## 2018-05-15 DIAGNOSIS — E039 Hypothyroidism, unspecified: Secondary | ICD-10-CM | POA: Diagnosis not present

## 2018-05-16 LAB — T4, FREE: Free T4: 1.1 ng/dL (ref 0.8–1.8)

## 2018-05-16 LAB — THYROID PEROXIDASE ANTIBODY: THYROID PEROXIDASE ANTIBODY: 760 [IU]/mL — AB (ref <9)

## 2018-05-16 LAB — TSH: TSH: 8.15 m[IU]/L — AB

## 2018-05-16 LAB — T3, FREE: T3, Free: 3.1 pg/mL (ref 2.3–4.2)

## 2018-05-16 LAB — THYROGLOBULIN ANTIBODY: THYROGLOBULIN AB: 4 [IU]/mL — AB (ref <=1)

## 2018-05-21 DIAGNOSIS — Z23 Encounter for immunization: Secondary | ICD-10-CM | POA: Diagnosis not present

## 2018-05-23 ENCOUNTER — Ambulatory Visit (INDEPENDENT_AMBULATORY_CARE_PROVIDER_SITE_OTHER): Payer: BLUE CROSS/BLUE SHIELD | Admitting: "Endocrinology

## 2018-05-23 VITALS — BP 122/88 | HR 80 | Ht 68.5 in | Wt 212.0 lb

## 2018-05-23 DIAGNOSIS — E063 Autoimmune thyroiditis: Secondary | ICD-10-CM

## 2018-05-23 DIAGNOSIS — E038 Other specified hypothyroidism: Secondary | ICD-10-CM

## 2018-05-23 MED ORDER — LEVOTHYROXINE SODIUM 100 MCG PO TABS: 100.0000 ug | ORAL_TABLET | Freq: Every day | ORAL | 1 refills | Status: DC

## 2018-05-23 NOTE — Progress Notes (Signed)
Endocrinology follow-up  Note                                         05/23/2018, 1:16 PM   April Pennington is a 57 y.o.-year-old female patient being seen in follow-up for hypothyroidism referred by Shawnie Dapper, PA-C.   Past Medical History:  Diagnosis Date  . Gallstones   . GERD (gastroesophageal reflux disease)    uses Alka seltzer   Past Surgical History:  Procedure Laterality Date  . BREAST SURGERY     cyst removed from right breast at age 75  . CHOLECYSTECTOMY N/A 05/20/2013   Procedure: LAPAROSCOPIC CHOLECYSTECTOMY WITH INTRAOPERATIVE CHOLANGIOGRAM;  Surgeon: Axel Filler, MD;  Location: [facility] OR;  Service: General;  Laterality: N/A;  . TUBAL LIGATION     Social History   Socioeconomic History  . Marital status: Married    Spouse name: Not on file  . Number of children: Not on file  . Years of education: Not on file  . Highest education level: Not on file  Occupational History  . Not on file  Social Needs  . Financial resource strain: Not on file  . Food insecurity:    Worry: Not on file    Inability: Not on file  . Transportation needs:    Medical: Not on file    Non-medical: Not on file  Tobacco Use  . Smoking status: Never Smoker  . Smokeless tobacco: Never Used  Substance and Sexual Activity  . Alcohol use: No    Frequency: Never  . Drug use: No  . Sexual activity: Not on file  Lifestyle  . Physical activity:    Days per week: Not on file    Minutes per session: Not on file  . Stress: Not on file  Relationships  . Social connections:    Talks on phone: Not on file    Gets together: Not on file    Attends religious service: Not on file    Active member of club or organization: Not on file    Attends meetings of clubs or organizations: Not on file    Relationship status: Not on file  Other Topics Concern  . Not on file  Social History Narrative   ** Merged History  Encounter **       Outpatient Encounter Medications as of 05/23/2018  Medication Sig  . ciprofloxacin (CIPRO) 500 MG tablet Take 1 tablet (500 mg total) by mouth 2 (two) times daily.  Marland Kitchen levothyroxine (SYNTHROID, LEVOTHROID) 100 MCG tablet Take 1 tablet (100 mcg total) by mouth daily before breakfast.  . milk thistle 175 MG tablet Take 175 mg by mouth daily.  . [DISCONTINUED] levothyroxine (SYNTHROID, LEVOTHROID) 75 MCG tablet Take 1 tablet (75 mcg total) by mouth daily before breakfast.   No facility-administered encounter medications on file as of 05/23/2018.    ALLERGIES: Allergies  Allergen Reactions  . Hydrocodone Nausea And Vomiting  .  Hydrocodone   . Oxycodone Nausea And Vomiting  . Vicodin [Hydrocodone-Acetaminophen] Nausea And Vomiting  . Vicodin [Hydrocodone-Acetaminophen]    VACCINATION STATUS: Immunization History  Administered Date(s) Administered  . Tdap 09/21/2016     HPI    April Pennington  is a patient with the above medical history. she was diagnosed  with hypothyroidism at approximate age of 60 years with lab work after presentation with symptoms and signs consistent with hypothyroidism.  Her previsit labs confirm etiology of hypothyroidism to be Hashimoto's thyroiditis.  She is currently on Synthroid 75 mcg p.o. every morning.  She continues to tolerate her medication.  She reports compliance.   -She has no new complaints except the fact that she cannot lose weight. Pt denies feeling nodules in neck, hoarseness, dysphagia/odynophagia, SOB with lying down.  She has + FH of thyroid disorders in: Her mother who is taking thyroid hormone supplements. No FH of thyroid cancer.  No h/o radiation tx to head or neck. No recent use of iodine supplements.   ROS:  Constitutional:  - fatigue, - subjective  hypothermia Eyes: no blurry vision, no xerophthalmia ENT: no sore throat, no nodules palpated in throat, no dysphagia/odynophagia, no hoarseness Musculoskeletal:  no muscle/joint aches Skin: no rashes Neurological: no tremors, no numbness, no tingling, no dizziness Psychiatric: no depression, no anxiety   Physical Exam: BP 122/88   Pulse 80   Ht 5' 8.5" (1.74 m)   Wt 212 lb (96.2 kg)   BMI 31.77 kg/m  Wt Readings from Last 3 Encounters:  05/23/18 212 lb (96.2 kg)  03/28/18 210 lb (95.3 kg)  03/05/18 211 lb (95.7 kg)    Constitutional:  + Obese for height, not in acute distress, normal state of mind Eyes: PERRLA, EOMI, no exophthalmos ENT: moist mucous membranes, + palpable thyromegaly, no cervical lymphadenopathy Musculoskeletal: no gross deformities, strength intact in all four extremities Skin:  + Dry , warm, no rashes Neurological: no tremor with outstretched hands, Deep tendon reflexes normal in all four extremities. CMP     Component Value Date/Time   NA 137 03/28/2018 1935   K 3.8 03/28/2018 1935   CL 103 03/28/2018 1935   CO2 26 03/28/2018 1935   GLUCOSE 100 (H) 03/28/2018 1935   BUN 10 03/28/2018 1935   CREATININE 0.85 03/28/2018 1935   CALCIUM 9.3 03/28/2018 1935   PROT 8.2 (H) 03/28/2018 1935   ALBUMIN 4.5 03/28/2018 1935   AST 23 03/28/2018 1935   ALT 37 03/28/2018 1935   ALKPHOS 80 03/28/2018 1935   BILITOT 0.9 03/28/2018 1935   GFRNONAA >60 03/28/2018 1935   GFRAA >60 03/28/2018 1935    Lab Results  Component Value Date   TSH 8.15 (H) 05/15/2018   TSH 9.43 (H) 11/02/2016   FREET4 1.1 05/15/2018      Right lobe of her thyroid is 4.6 cm, left lobe is 1.5 cm.  No discrete nodules on bilateral thyroid lobes.   ASSESSMENT: 1. Hypothyroidism due to Hashimoto's thyroiditis 2.  Asymmetric thyroid  PLAN:  -Her previsit labs are consistent with inadequate replacement.  I discussed and increased her Synthroid to 100 mcg p.o. every morning.   - We discussed about correct intake of levothyroxine, at fasting, with water, separated by at least 30 minutes from breakfast, and separated by more than 4 hours from  calcium, iron, multivitamins, acid reflux medications (PPIs). -Patient is made aware of the fact that thyroid hormone replacement is needed for life, dose to be adjusted by  periodic monitoring of thyroid function tests.  - Will check thyroid tests before next visit: TSH, free T4 -Due to palpable thyromegaly, her thyroid ultrasound is remarkable for asymmetric thyroid with no discrete nodular lesions.  No follow-ups on file.  Marquis LunchGebre Earline Stiner, MD [facility] [address] [place], [place] [address] Phone: [phone]  Fax: [phone]   05/23/2018, 1:16 PM  This note was partially dictated with voice recognition software. Similar sounding words can be transcribed inadequately or may not  be corrected upon review.

## 2018-11-10 ENCOUNTER — Other Ambulatory Visit: Payer: Self-pay | Admitting: "Endocrinology

## 2018-11-15 ENCOUNTER — Other Ambulatory Visit: Payer: Self-pay | Admitting: "Endocrinology

## 2018-11-15 DIAGNOSIS — E063 Autoimmune thyroiditis: Secondary | ICD-10-CM | POA: Diagnosis not present

## 2018-11-15 DIAGNOSIS — E038 Other specified hypothyroidism: Secondary | ICD-10-CM | POA: Diagnosis not present

## 2018-11-16 LAB — T4, FREE: Free T4: 1.1 ng/dL (ref 0.8–1.8)

## 2018-11-16 LAB — TSH: TSH: 8.05 mIU/L — ABNORMAL HIGH (ref 0.40–4.50)

## 2018-11-22 ENCOUNTER — Encounter: Payer: Self-pay | Admitting: "Endocrinology

## 2018-11-22 ENCOUNTER — Other Ambulatory Visit: Payer: Self-pay

## 2018-11-22 ENCOUNTER — Ambulatory Visit (INDEPENDENT_AMBULATORY_CARE_PROVIDER_SITE_OTHER): Payer: BLUE CROSS/BLUE SHIELD | Admitting: "Endocrinology

## 2018-11-22 DIAGNOSIS — E038 Other specified hypothyroidism: Secondary | ICD-10-CM | POA: Diagnosis not present

## 2018-11-22 DIAGNOSIS — E063 Autoimmune thyroiditis: Secondary | ICD-10-CM

## 2018-11-22 MED ORDER — LEVOTHYROXINE SODIUM 125 MCG PO TABS: 125.0000 ug | ORAL_TABLET | Freq: Every day | ORAL | 1 refills | Status: DC

## 2018-11-22 NOTE — Progress Notes (Signed)
11/22/2018, 12:19 PM                                Endocrinology Telehealth Visit Follow up Note -During COVID -19 Pandemic  I connected with April Pennington on 11/22/2018   by telephone and verified that I am speaking with the correct person using two identifiers. April Pennington, 10-May-1961. she has verbally consented to this visit. All issues noted in this document were discussed and addressed. The format was not optimal for physical exam.    April Pennington is a 58 y.o.-year-old female patient being engaged in telehealth for follow-up for hypothyroidism related to Hashimoto's thyroiditis.       Past Medical History:  Diagnosis Date  . Gallstones   . GERD (gastroesophageal reflux disease)    uses Alka seltzer   Past Surgical History:  Procedure Laterality Date  . BREAST SURGERY     cyst removed from right breast at age 58  . CHOLECYSTECTOMY N/A 05/20/2013   Procedure: LAPAROSCOPIC CHOLECYSTECTOMY WITH INTRAOPERATIVE CHOLANGIOGRAM;  Surgeon: Ralene Ok, MD;  Location: [facility];  Service: General;  Laterality: N/A;  . TUBAL LIGATION     Social History   Socioeconomic History  . Marital status: Married    Spouse name: Not on file  . Number of children: Not on file  . Years of education: Not on file  . Highest education level: Not on file  Occupational History  . Not on file  Social Needs  . Financial resource strain: Not on file  . Food insecurity    Worry: Not on file    Inability: Not on file  . Transportation needs    Medical: Not on file    Non-medical: Not on file  Tobacco Use  . Smoking status: Never Smoker  . Smokeless tobacco: Never Used  Substance and Sexual Activity  . Alcohol use: No    Frequency: Never  . Drug use: No  . Sexual activity: Not on file  Lifestyle  . Physical activity    Days per week: Not on file    Minutes per session: Not on file  . Stress: Not on file   Relationships  . Social Herbalist on phone: Not on file    Gets together: Not on file    Attends religious service: Not on file    Active member of club or organization: Not on file    Attends meetings of clubs or organizations: Not on file    Relationship status: Not on file  Other Topics Concern  . Not on file  Social History Narrative   ** Merged History Encounter **       Outpatient Encounter Medications as of 11/22/2018  Medication Sig  . ciprofloxacin (CIPRO) 500 MG tablet Take 1 tablet (500 mg total) by mouth 2 (two) times daily.  Marland Kitchen levothyroxine (SYNTHROID) 125 MCG tablet Take 1 tablet (125 mcg total) by mouth daily before breakfast.  . milk thistle 175 MG  tablet Take 175 mg by mouth daily.  . [DISCONTINUED] levothyroxine (SYNTHROID) 100 MCG tablet TAKE 1 TABLET(100 MCG) BY MOUTH DAILY BEFORE BREAKFAST   No facility-administered encounter medications on file as of 11/22/2018.    ALLERGIES: Allergies  Allergen Reactions  . Hydrocodone Nausea And Vomiting  . Hydrocodone   . Oxycodone Nausea And Vomiting  . Vicodin [Hydrocodone-Acetaminophen] Nausea And Vomiting  . Vicodin [Hydrocodone-Acetaminophen]    VACCINATION STATUS: Immunization History  Administered Date(s) Administered  . Tdap 09/21/2016     HPI    April Pennington  is a patient with the above medical history. she was diagnosed  with hypothyroidism at approximate age of 58 years which was recently confirmed to be related to her Hashimoto's thyroiditis.  She is currently on levothyroxine 100 mcg p.o. every morning.  She reports compliance, has no new complaints.    She continues to tolerate her medication.  -She continues to have issues with dry skin, dry mouth and throat.    Pt denies feeling nodules in neck, hoarseness, dysphagia/odynophagia, SOB with lying down. -She describes unexplained nocturia.  She was never diagnosed with diabetes.  She has + FH of thyroid disorders in: Her mother who is  taking thyroid hormone supplements. No FH of thyroid cancer.    ROS: Limited as above.   Physical Exam: There were no vitals taken for this visit. Wt Readings from Last 3 Encounters:  05/23/18 212 lb (96.2 kg)  03/28/18 210 lb (95.3 kg)  03/05/18 211 lb (95.7 kg)    CMP     Component Value Date/Time   NA 137 03/28/2018 1935   K 3.8 03/28/2018 1935   CL 103 03/28/2018 1935   CO2 26 03/28/2018 1935   GLUCOSE 100 (H) 03/28/2018 1935   BUN 10 03/28/2018 1935   CREATININE 0.85 03/28/2018 1935   CALCIUM 9.3 03/28/2018 1935   PROT 8.2 (H) 03/28/2018 1935   ALBUMIN 4.5 03/28/2018 1935   AST 23 03/28/2018 1935   ALT 37 03/28/2018 1935   ALKPHOS 80 03/28/2018 1935   BILITOT 0.9 03/28/2018 1935   GFRNONAA >60 03/28/2018 1935   GFRAA >60 03/28/2018 1935    Lab Results  Component Value Date   TSH 8.05 (H) 11/15/2018   TSH 8.15 (H) 05/15/2018   TSH 9.43 (H) 11/02/2016   FREET4 1.1 11/15/2018   FREET4 1.1 05/15/2018      Right lobe of her thyroid is 4.6 cm, left lobe is 1.5 cm.  No discrete nodules on bilateral thyroid lobes.   ASSESSMENT: 1. Hypothyroidism due to Hashimoto's thyroiditis 2.  Asymmetric thyroid  PLAN:  -Her previsit labs are consistent with inadequate replacement.  I discussed and increased her Synthroid to 125 mcg p.o.  every morning.  - We discussed about the correct intake of her thyroid hormone, on empty stomach at fasting, with water, separated by at least 30 minutes from breakfast and other medications,  and separated by more than 4 hours from calcium, iron, multivitamins, acid reflux medications (PPIs). -Patient is made aware of the fact that thyroid hormone replacement is needed for life, dose to be adjusted by periodic monitoring of thyroid function tests. Her complaint of nocturia, she is offered A1c/CMP before her next visit.  -Due to palpable thyromegaly, her thyroid ultrasound is remarkable for asymmetric thyroid with no discrete nodular  lesions.  Return in about 6 months (around 05/24/2019) for Follow up with Pre-visit Labs.  Marquis LunchGebre Attilio Zeitler, MD [facility] [phone]  [address] [place], [place] [address] Phone: [phone]  Fax: [phone]   11/22/2018, 12:19 PM  This note was partially dictated with voice recognition software. Similar sounding words can be transcribed inadequately or may not  be corrected upon review.

## 2019-03-19 DIAGNOSIS — E038 Other specified hypothyroidism: Secondary | ICD-10-CM | POA: Diagnosis not present

## 2019-03-19 DIAGNOSIS — E559 Vitamin D deficiency, unspecified: Secondary | ICD-10-CM | POA: Diagnosis not present

## 2019-03-19 DIAGNOSIS — E063 Autoimmune thyroiditis: Secondary | ICD-10-CM | POA: Diagnosis not present

## 2019-03-20 LAB — COMPLETE METABOLIC PANEL WITH GFR
AG Ratio: 1.6 (calc) (ref 1.0–2.5)
ALT: 34 U/L — ABNORMAL HIGH (ref 6–29)
AST: 19 U/L (ref 10–35)
Albumin: 4.2 g/dL (ref 3.6–5.1)
Alkaline phosphatase (APISO): 78 U/L (ref 37–153)
BUN: 16 mg/dL (ref 7–25)
CO2: 31 mmol/L (ref 20–32)
Calcium: 9.5 mg/dL (ref 8.6–10.4)
Chloride: 103 mmol/L (ref 98–110)
Creat: 0.92 mg/dL (ref 0.50–1.05)
GFR, Est African American: 81 mL/min/{1.73_m2} (ref >=60)
GFR, Est Non African American: 70 mL/min/{1.73_m2} (ref >=60)
Globulin: 2.6 g/dL (calc) (ref 1.9–3.7)
Glucose, Bld: 84 mg/dL (ref 65–99)
Potassium: 4.4 mmol/L (ref 3.5–5.3)
Sodium: 138 mmol/L (ref 135–146)
Total Bilirubin: 0.6 mg/dL (ref 0.2–1.2)
Total Protein: 6.8 g/dL (ref 6.1–8.1)

## 2019-03-20 LAB — HEMOGLOBIN A1C
Hgb A1c MFr Bld: 5.4 % of total Hgb (ref <5.7)
Mean Plasma Glucose: 108 (calc)
eAG (mmol/L): 6 (calc)

## 2019-03-20 LAB — T4, FREE: Free T4: 1.3 ng/dL (ref 0.8–1.8)

## 2019-03-20 LAB — VITAMIN D 25 HYDROXY (VIT D DEFICIENCY, FRACTURES): Vit D, 25-Hydroxy: 22 ng/mL — ABNORMAL LOW (ref 30–100)

## 2019-03-20 LAB — TSH: TSH: 0.95 mIU/L (ref 0.40–4.50)

## 2019-03-25 ENCOUNTER — Other Ambulatory Visit: Payer: Self-pay

## 2019-03-25 ENCOUNTER — Encounter: Payer: Self-pay | Admitting: "Endocrinology

## 2019-03-25 ENCOUNTER — Ambulatory Visit (INDEPENDENT_AMBULATORY_CARE_PROVIDER_SITE_OTHER): Payer: BLUE CROSS/BLUE SHIELD | Admitting: "Endocrinology

## 2019-03-25 DIAGNOSIS — E063 Autoimmune thyroiditis: Secondary | ICD-10-CM | POA: Diagnosis not present

## 2019-03-25 DIAGNOSIS — E038 Other specified hypothyroidism: Secondary | ICD-10-CM

## 2019-03-25 MED ORDER — VITAMIN D3 125 MCG (5000 UT) PO CAPS: 5000.0000 [IU] | ORAL_CAPSULE | Freq: Every day | ORAL | 0 refills | Status: DC

## 2019-03-25 MED ORDER — LEVOTHYROXINE SODIUM 125 MCG PO TABS: 125.0000 ug | ORAL_TABLET | Freq: Every day | ORAL | 1 refills | Status: DC

## 2019-03-25 NOTE — Progress Notes (Signed)
03/25/2019, 11:15 AM                                Endocrinology Telehealth Visit Follow up Note -During COVID -19 Pandemic  I connected with April Pennington on 03/25/2019   by telephone and verified that I am speaking with the correct person using two identifiers. April Pennington, [birth year]. she has verbally consented to this visit. All issues noted in this document were discussed and addressed. The format was not optimal for physical exam.    April Pennington is a 58 y.o.-year-old female patient being engaged in telehealth for follow-up for hypothyroidism related to Hashimoto's thyroiditis.       Past Medical History:  Diagnosis Date  . Gallstones   . GERD (gastroesophageal reflux disease)    uses Alka seltzer   Past Surgical History:  Procedure Laterality Date  . BREAST SURGERY     cyst removed from right breast at age 27  . CHOLECYSTECTOMY N/A 05/20/2013   Procedure: LAPAROSCOPIC CHOLECYSTECTOMY WITH INTRAOPERATIVE CHOLANGIOGRAM;  Surgeon: Axel Filler, MD;  Location: [facility];  Service: General;  Laterality: N/A;  . TUBAL LIGATION     Social History   Socioeconomic History  . Marital status: Married    Spouse name: Not on file  . Number of children: Not on file  . Years of education: Not on file  . Highest education level: Not on file  Occupational History  . Not on file  Social Needs  . Financial resource strain: Not on file  . Food insecurity    Worry: Not on file    Inability: Not on file  . Transportation needs    Medical: Not on file    Non-medical: Not on file  Tobacco Use  . Smoking status: Never Smoker  . Smokeless tobacco: Never Used  Substance and Sexual Activity  . Alcohol use: No    Frequency: Never  . Drug use: No  . Sexual activity: Not on file  Lifestyle  . Physical activity    Days per week: Not on file    Minutes per session: Not on file  . Stress: Not on file   Relationships  . Social Musician on phone: Not on file    Gets together: Not on file    Attends religious service: Not on file    Active member of club or organization: Not on file    Attends meetings of clubs or organizations: Not on file    Relationship status: Not on file  Other Topics Concern  . Not on file  Social History Narrative   ** Merged History Encounter **       Outpatient Encounter Medications as of 03/25/2019  Medication Sig  . ciprofloxacin (CIPRO) 500 MG tablet Take 1 tablet (500 mg total) by mouth 2 (two) times daily.  Marland Kitchen levothyroxine (SYNTHROID) 125 MCG tablet Take 1 tablet (125 mcg total) by mouth daily before breakfast.  . milk thistle 175 MG  tablet Take 175 mg by mouth daily.   No facility-administered encounter medications on file as of 03/25/2019.    ALLERGIES: Allergies  Allergen Reactions  . Hydrocodone Nausea And Vomiting  . Hydrocodone   . Oxycodone Nausea And Vomiting  . Vicodin [Hydrocodone-Acetaminophen] Nausea And Vomiting  . Vicodin [Hydrocodone-Acetaminophen]    VACCINATION STATUS: Immunization History  Administered Date(s) Administered  . Tdap 09/21/2016     HPI    April Pennington  is a patient with the above medical history. she was diagnosed  with hypothyroidism at approximate age of 58 years which was recently confirmed to be related to her Hashimoto's thyroiditis.  She is currently on Synthroid 125 mcg p.o. daily before breakfast. She reports compliance, has no new complaints.    She continues to tolerate her medication.  -She continues to have issues with dry skin, dry nails, dry mouth and throat.    She denies feeling nodules in neck, hoarseness, dysphagia/odynophagia, SOB with lying down. -She describes unexplained nocturia.  She was never diagnosed with diabetes.  She has + FH of thyroid disorders in: Her mother who is taking thyroid hormone supplements. No FH of thyroid cancer.    ROS: Limited as  above.   Physical Exam: There were no vitals taken for this visit. Wt Readings from Last 3 Encounters:  05/23/18 212 lb (96.2 kg)  03/28/18 210 lb (95.3 kg)  03/05/18 211 lb (95.7 kg)    CMP     Component Value Date/Time   NA 138 03/19/2019 0927   K 4.4 03/19/2019 0927   CL 103 03/19/2019 0927   CO2 31 03/19/2019 0927   GLUCOSE 84 03/19/2019 0927   BUN 16 03/19/2019 0927   CREATININE 0.92 03/19/2019 0927   CALCIUM 9.5 03/19/2019 0927   PROT 6.8 03/19/2019 0927   ALBUMIN 4.5 03/28/2018 1935   AST 19 03/19/2019 0927   ALT 34 (H) 03/19/2019 0927   ALKPHOS 80 03/28/2018 1935   BILITOT 0.6 03/19/2019 0927   GFRNONAA 70 03/19/2019 0927   GFRAA 81 03/19/2019 0927    Lab Results  Component Value Date   TSH 0.95 03/19/2019   TSH 8.05 (H) 11/15/2018   TSH 8.15 (H) 05/15/2018   TSH 9.43 (H) 11/02/2016   FREET4 1.3 03/19/2019   FREET4 1.1 11/15/2018   FREET4 1.1 05/15/2018      Right lobe of her thyroid is 4.6 cm, left lobe is 1.5 cm.  No discrete nodules on bilateral thyroid lobes.   ASSESSMENT: 1. Hypothyroidism due to Hashimoto's thyroiditis 2.  Asymmetric thyroid 3.  Vitamin D deficiency.  PLAN:  -Her previsit labs are consistent with appropriate replacement with Synthroid.  She is advised to continue Synthroid 125 mcg p.o. daily before breakfast.    - We discussed about the correct intake of her thyroid hormone, on empty stomach at fasting, with water, separated by at least 30 minutes from breakfast and other medications,  and separated by more than 4 hours from calcium, iron, multivitamins, acid reflux medications (PPIs). -Patient is made aware of the fact that thyroid hormone replacement is needed for life, dose to be adjusted by periodic monitoring of thyroid function tests. -Her labs rule out diabetes, CMP is normal.  She is advised to start vitamin D supplement with vitamin D3 5000 units daily for the next 90 days.  -Due to palpable thyromegaly, her thyroid  ultrasound is remarkable for asymmetric thyroid with no discrete nodular lesions.   She will return in 6 months  with repeat thyroid function tests.  Time for this visit: 15 minutes. [name]  participated in the discussions, expressed understanding, and voiced agreement with the above plans.  All questions were answered to her satisfaction. she is encouraged to contact clinic should she have any questions or concerns prior to her return visit.  Glade Lloyd, MD [facility] [address] [place], [place] [address] Phone: [phone]  Fax: [phone]   03/25/2019, 11:15 AM  This note was partially dictated with voice recognition software. Similar sounding words can be transcribed inadequately or may not  be corrected upon review.

## 2019-05-26 ENCOUNTER — Ambulatory Visit: Payer: BLUE CROSS/BLUE SHIELD | Admitting: "Endocrinology

## 2019-09-24 ENCOUNTER — Ambulatory Visit: Payer: BLUE CROSS/BLUE SHIELD | Admitting: "Endocrinology

## 2019-10-06 ENCOUNTER — Other Ambulatory Visit: Payer: Self-pay

## 2019-10-06 DIAGNOSIS — E038 Other specified hypothyroidism: Secondary | ICD-10-CM

## 2019-10-06 DIAGNOSIS — E063 Autoimmune thyroiditis: Secondary | ICD-10-CM

## 2019-10-07 LAB — TSH: TSH: 2.13 mIU/L (ref 0.40–4.50)

## 2019-10-07 LAB — T4, FREE: Free T4: 1.2 ng/dL (ref 0.8–1.8)

## 2019-10-13 ENCOUNTER — Ambulatory Visit: Payer: BLUE CROSS/BLUE SHIELD | Admitting: "Endocrinology

## 2019-10-13 ENCOUNTER — Other Ambulatory Visit: Payer: Self-pay

## 2019-10-13 ENCOUNTER — Encounter: Payer: Self-pay | Admitting: "Endocrinology

## 2019-10-13 VITALS — BP 131/85 | HR 86 | Ht 69.0 in | Wt 215.6 lb

## 2019-10-13 DIAGNOSIS — E038 Other specified hypothyroidism: Secondary | ICD-10-CM

## 2019-10-13 DIAGNOSIS — E063 Autoimmune thyroiditis: Secondary | ICD-10-CM | POA: Diagnosis not present

## 2019-10-13 DIAGNOSIS — E559 Vitamin D deficiency, unspecified: Secondary | ICD-10-CM

## 2019-10-13 MED ORDER — LEVOTHYROXINE SODIUM 125 MCG PO TABS: 125.0000 ug | ORAL_TABLET | Freq: Every day | ORAL | 1 refills | Status: DC

## 2019-10-13 NOTE — Progress Notes (Signed)
10/13/2019, 6:11 PM           Endocrinology follow-up note    April Pennington is a 59 y.o.-year-old female patient being engaged in telehealth for follow-up for hypothyroidism related to Hashimoto's thyroiditis, vitamin D deficiency.   Past Medical History:  Diagnosis Date  . Gallstones   . GERD (gastroesophageal reflux disease)    uses Alka seltzer   Past Surgical History:  Procedure Laterality Date  . BREAST SURGERY     cyst removed from right breast at age 60  . CHOLECYSTECTOMY N/A 05/20/2013   Procedure: LAPAROSCOPIC CHOLECYSTECTOMY WITH INTRAOPERATIVE CHOLANGIOGRAM;  Surgeon: Ralene Ok, MD;  Location: [facility];  Service: General;  Laterality: N/A;  . TUBAL LIGATION     Social History   Socioeconomic History  . Marital status: Married    Spouse name: Not on file  . Number of children: Not on file  . Years of education: Not on file  . Highest education level: Not on file  Occupational History  . Not on file  Tobacco Use  . Smoking status: Never Smoker  . Smokeless tobacco: Never Used  Substance and Sexual Activity  . Alcohol use: No  . Drug use: No  . Sexual activity: Not on file  Other Topics Concern  . Not on file  Social History Narrative   ** Merged History Encounter **       Social Determinants of Health   Financial Resource Strain:   . Difficulty of Paying Living Expenses:   Food Insecurity:   . Worried About Charity fundraiser in the Last Year:   . Arboriculturist in the Last Year:   Transportation Needs:   . Film/video editor (Medical):   Marland Kitchen Lack of Transportation (Non-Medical):   Physical Activity:   . Days of Exercise per Week:   . Minutes of Exercise per Session:   Stress:   . Feeling of Stress :   Social Connections:   . Frequency of Communication with Friends and Family:   . Frequency of Social Gatherings with Friends and Family:   . Attends Religious  Services:   . Active Member of Clubs or Organizations:   . Attends Archivist Meetings:   Marland Kitchen Marital Status:    Outpatient Encounter Medications as of 10/13/2019  Medication Sig  . Cholecalciferol (VITAMIN D3) 125 MCG (5000 UT) CAPS Take 1 capsule (5,000 Units total) by mouth daily.  . ciprofloxacin (CIPRO) 500 MG tablet Take 1 tablet (500 mg total) by mouth 2 (two) times daily.  Marland Kitchen levothyroxine (SYNTHROID) 125 MCG tablet Take 1 tablet (125 mcg total) by mouth daily before breakfast.  . milk thistle 175 MG tablet Take 175 mg by mouth daily.  . [DISCONTINUED] levothyroxine (SYNTHROID) 125 MCG tablet Take 1 tablet (125 mcg total) by mouth daily before breakfast.   No facility-administered encounter medications on file as of 10/13/2019.   ALLERGIES: Allergies  Allergen Reactions  . Hydrocodone Nausea And Vomiting  . Hydrocodone   . Oxycodone Nausea And Vomiting  .  Vicodin [Hydrocodone-Acetaminophen] Nausea And Vomiting  . Vicodin [Hydrocodone-Acetaminophen]    VACCINATION STATUS: Immunization History  Administered Date(s) Administered  . Tdap 09/21/2016     HPI    April Pennington  is a patient with the above medical history. she was diagnosed  with hypothyroidism at approximate age of 52 years which was recently confirmed to be related to her Hashimoto's thyroiditis.  She is currently on Synthroid 125 mcg p.o. daily before breakfast.  She reports compliance, feeling better, no new complaints today.      She continues to tolerate her medication.    She denies feeling nodules in neck, hoarseness, dysphagia/odynophagia, SOB with lying down. -She describes unexplained nocturia.  She was never diagnosed with diabetes.  She has + FH of thyroid disorders in: Her mother who is taking thyroid hormone supplements. No FH of thyroid cancer.    ROS: Limited as above.   Physical Exam: BP 131/85   Pulse 86   Ht 5\' 9"  (1.753 m)   Wt 215 lb 9.6 oz (97.8 kg)   BMI 31.84 kg/m  Wt  Readings from Last 3 Encounters:  10/13/19 215 lb 9.6 oz (97.8 kg)  05/23/18 212 lb (96.2 kg)  03/28/18 210 lb (95.3 kg)    CMP     Component Value Date/Time   NA 138 03/19/2019 0927   K 4.4 03/19/2019 0927   CL 103 03/19/2019 0927   CO2 31 03/19/2019 0927   GLUCOSE 84 03/19/2019 0927   BUN 16 03/19/2019 0927   CREATININE 0.92 03/19/2019 0927   CALCIUM 9.5 03/19/2019 0927   PROT 6.8 03/19/2019 0927   ALBUMIN 4.5 03/28/2018 1935   AST 19 03/19/2019 0927   ALT 34 (H) 03/19/2019 0927   ALKPHOS 80 03/28/2018 1935   BILITOT 0.6 03/19/2019 0927   GFRNONAA 70 03/19/2019 0927   GFRAA 81 03/19/2019 0927    Lab Results  Component Value Date   TSH 2.13 10/06/2019   TSH 0.95 03/19/2019   TSH 8.05 (H) 11/15/2018   TSH 8.15 (H) 05/15/2018   TSH 9.43 (H) 11/02/2016   FREET4 1.2 10/06/2019   FREET4 1.3 03/19/2019   FREET4 1.1 11/15/2018   FREET4 1.1 05/15/2018      Right lobe of her thyroid is 4.6 cm, left lobe is 1.5 cm.  No discrete nodules on bilateral thyroid lobes.   ASSESSMENT: 1. Hypothyroidism due to Hashimoto's thyroiditis 2.  Asymmetric thyroid 3.  Vitamin D deficiency.  PLAN:  -Her previsit labs are consistent with appropriate replacement with Synthroid.  She is advised to continue  Synthroid 125 mcg p.o. daily before breakfast.   - We discussed about the correct intake of her thyroid hormone, on empty stomach at fasting, with water, separated by at least 30 minutes from breakfast and other medications,  and separated by more than 4 hours from calcium, iron, multivitamins, acid reflux medications (PPIs). -Patient is made aware of the fact that thyroid hormone replacement is needed for life, dose to be adjusted by periodic monitoring of thyroid function tests.   -Her labs rule out diabetes, CMP is normal.  She is advised to continue vitamin D3 supplement at 5000 units daily for the next 90 days.  -Due to palpable thyromegaly, she was offered baseline thyroid  ultrasound.  Her thyroid ultrasound is remarkable for asymmetric thyroid with no discrete nodular lesions.   She will return in 6 months with repeat thyroid function tests.    - Time spent on this patient care  encounter:  20 minutes of which 50% was spent in  counseling and the rest reviewing  her current and  previous labs / studies and medications  doses and developing a plan for long term care. Alfredia Client  participated in the discussions, expressed understanding, and voiced agreement with the above plans.  All questions were answered to her satisfaction. she is encouraged to contact clinic should she have any questions or concerns prior to her return visit.   Marquis Lunch, MD [facility] [address] [place], [place] [address] Phone: [phone]  Fax: [phone]   10/13/2019, 6:11 PM  This note was partially dictated with voice recognition software. Similar sounding words can be transcribed inadequately or may not  be corrected upon review.

## 2019-10-14 ENCOUNTER — Ambulatory Visit: Payer: BLUE CROSS/BLUE SHIELD | Admitting: "Endocrinology

## 2020-02-07 IMAGING — US US THYROID
1 series · 14 of 25 positions shown · non-contrast
Comparison: None.

CLINICAL DATA: Hypothyroid.

EXAM:
THYROID ULTRASOUND
TECHNIQUE: Ultrasound examination of the thyroid gland and adjacent soft
tissues was performed.

[Series 1: us thyroid · 0.06mm/px · 14 of 50 slices shown]
[im 1/50]
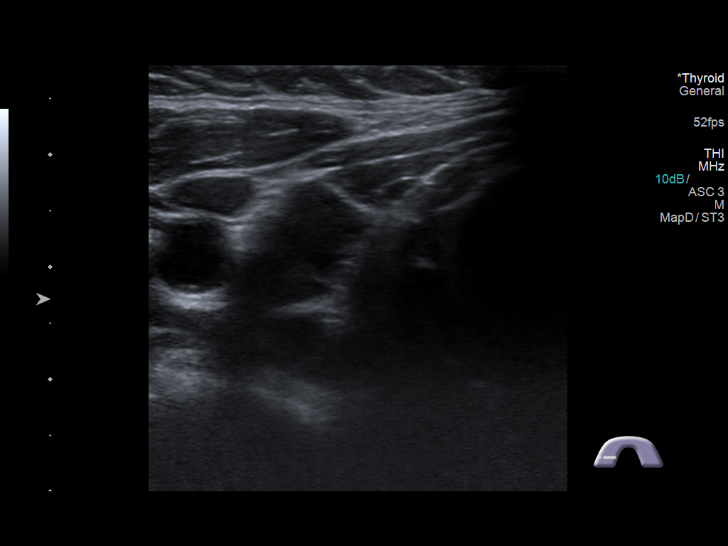
[im 5/50]
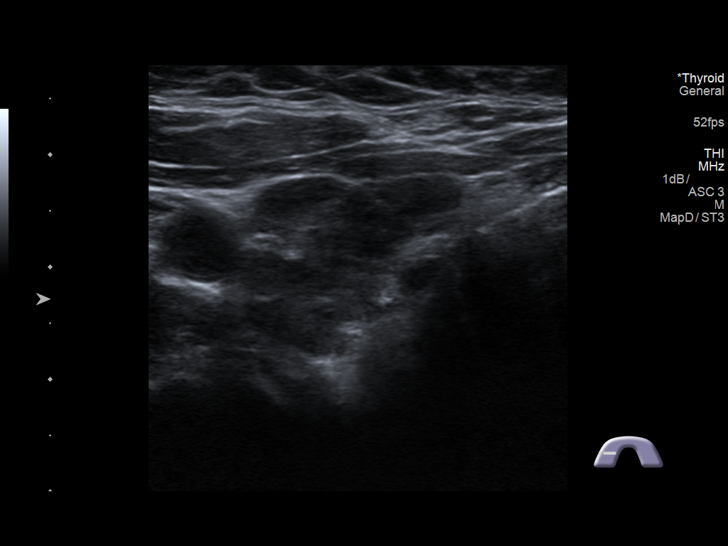
[im 9/50]
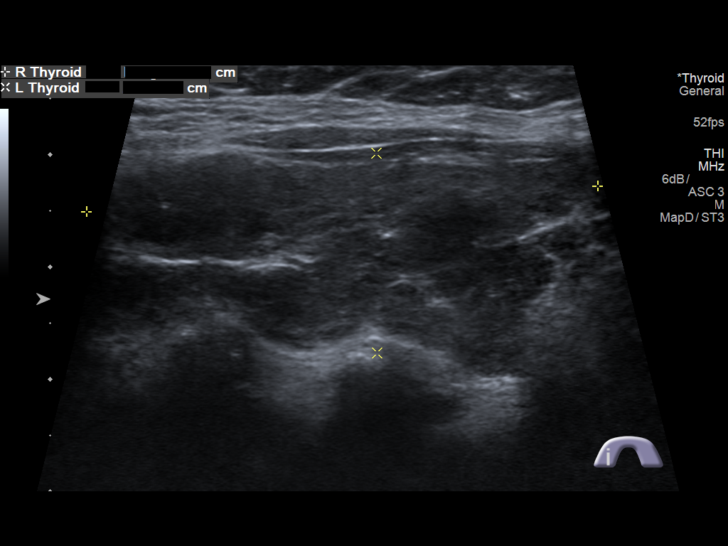
[im 13/50]
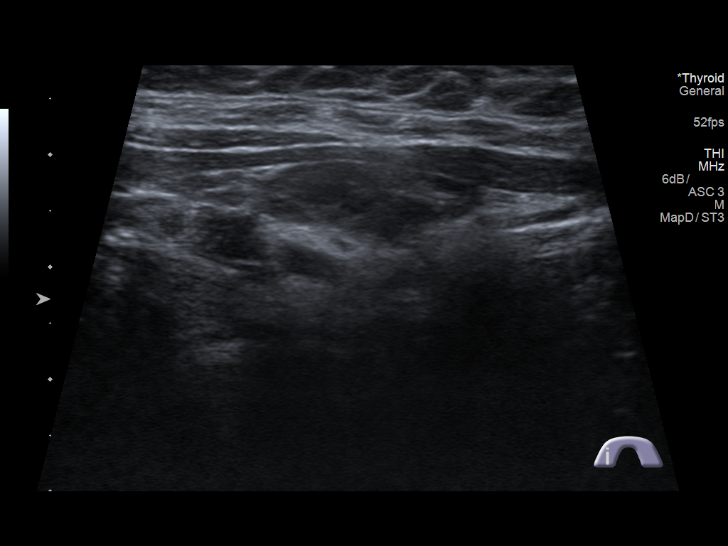
[im 17/50]
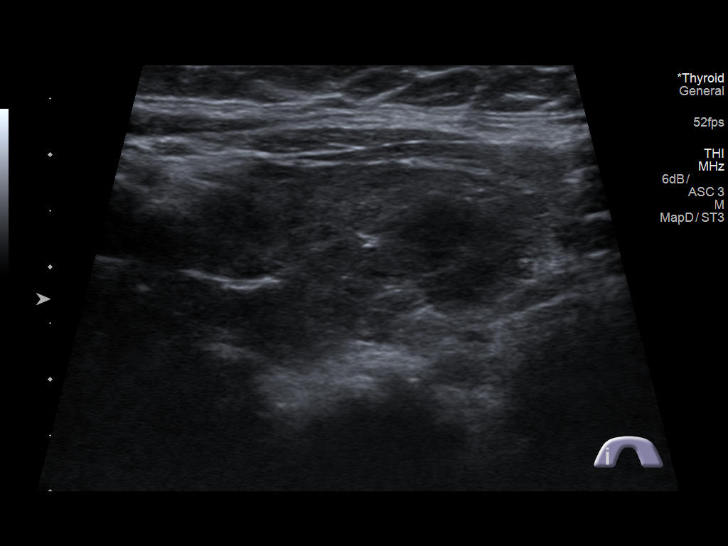
[im 19/50]
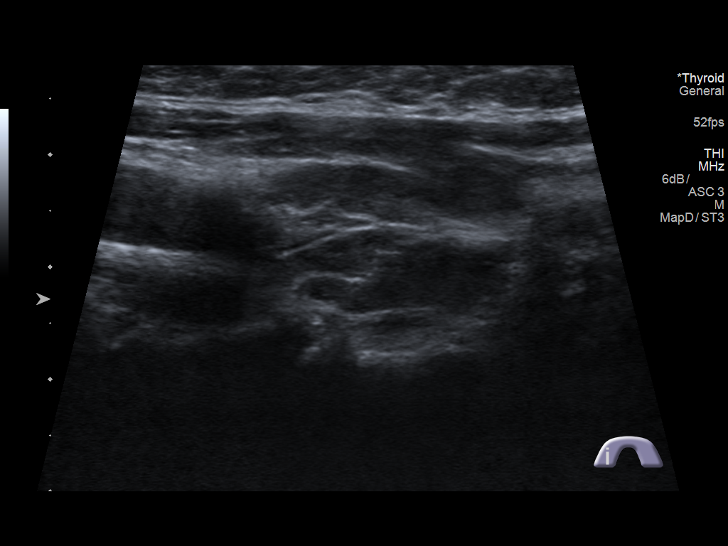
[im 23/50]
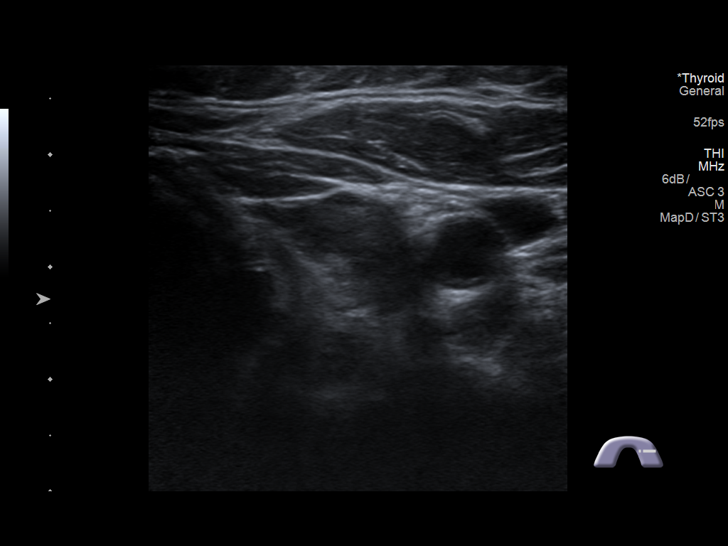
[im 27/50]
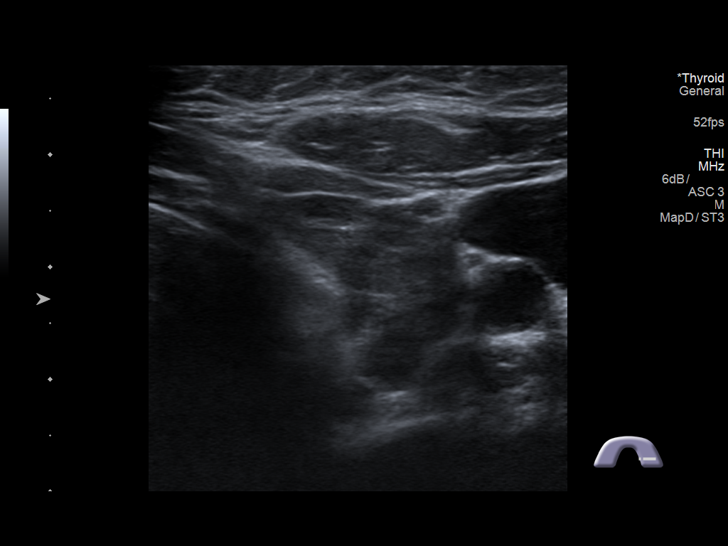
[im 31/50]
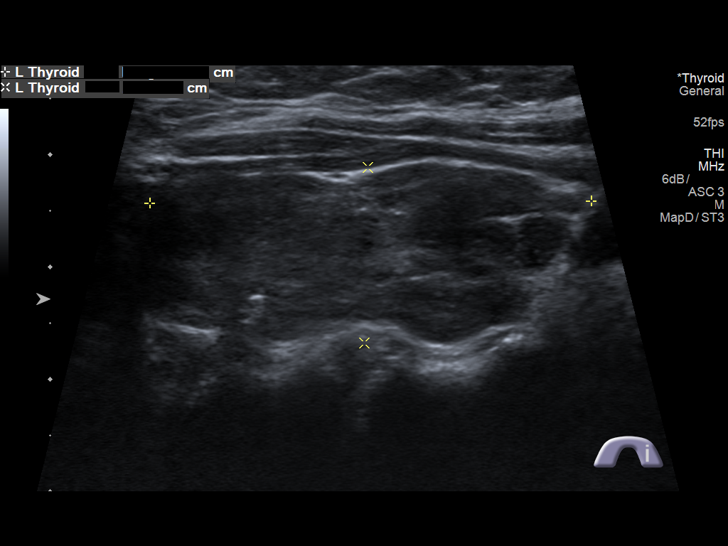
[im 33/50]
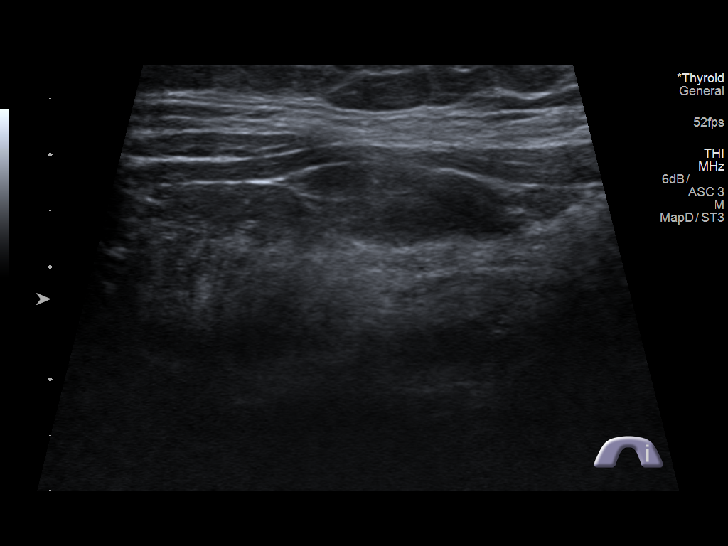
[im 37/50]
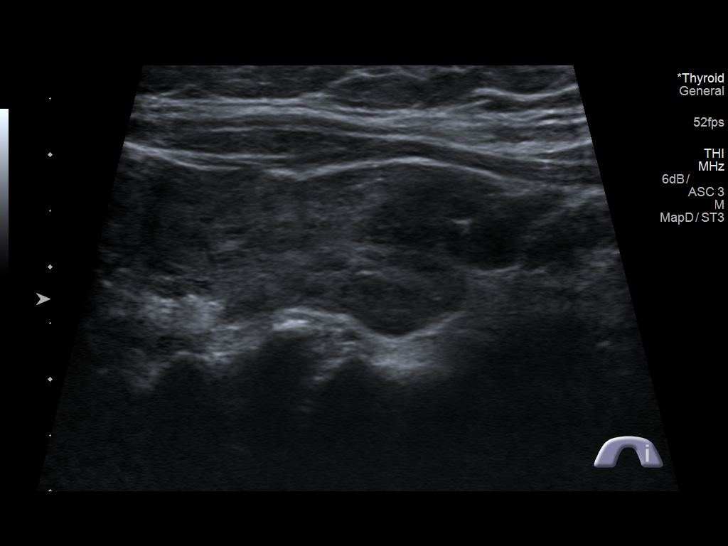
[im 41/50]
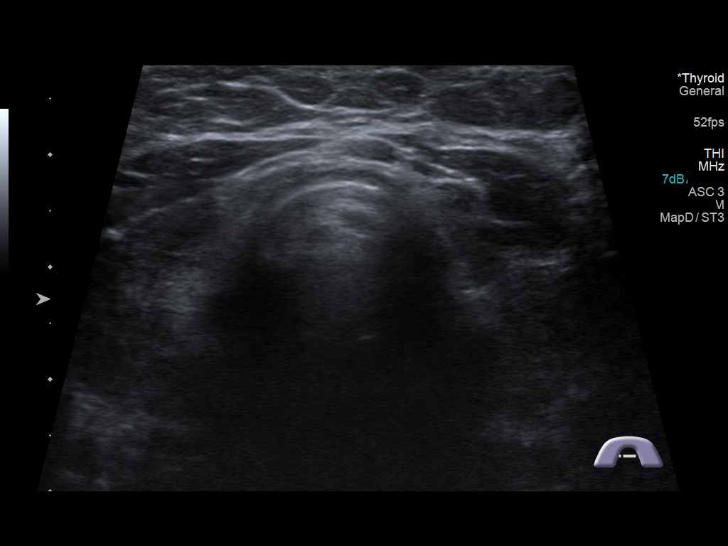
[im 45/50]
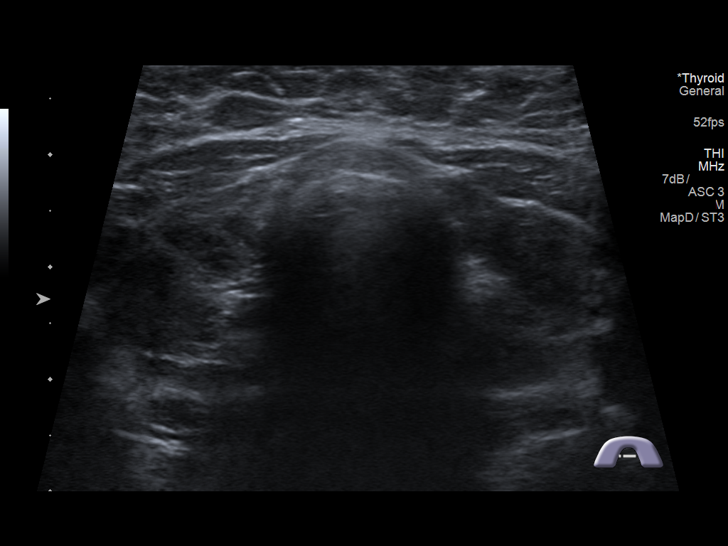
[im 50/50]
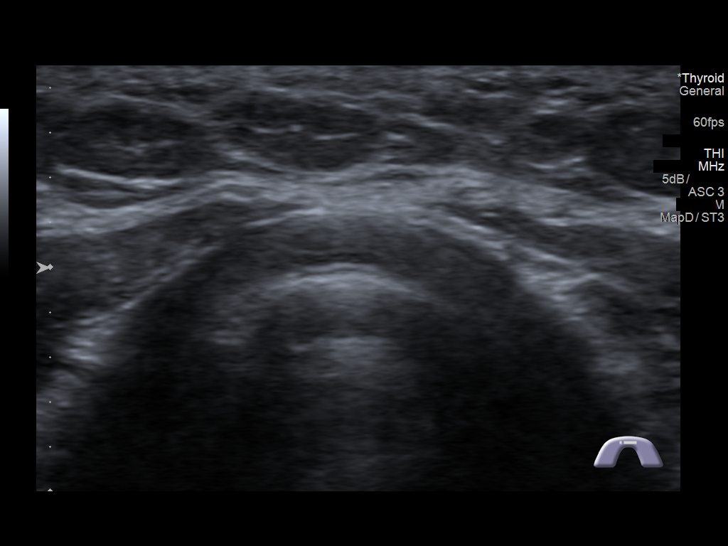

[14 of 25 positions shown; findings below may reference images not displayed]

FINDINGS: Parenchymal Echotexture: Moderately heterogenous

Isthmus: 0.2 cm

Right lobe: 4.6 x 1.8 x 1.6 cm

Left lobe: 0.6 x 1.5 cm

_________________________________________________________

Estimated total number of nodules >/= 1 cm: 0

Number of spongiform nodules >/=  2 cm not described below (TR1): 0

Number of mixed cystic and solid nodules >/= 1.5 cm not described
below (TR2): 0

_________________________________________________________

No discrete nodules are seen within the thyroid gland.
IMPRESSION: Heterogeneous thyroid parenchyma without focal nodules. The thyroid
gland is not significantly enlarged.

The above is in keeping with the ACR TI-RADS recommendations - [HOSPITAL] 6522;[DATE].

## 2020-04-08 DIAGNOSIS — E559 Vitamin D deficiency, unspecified: Secondary | ICD-10-CM | POA: Diagnosis not present

## 2020-04-08 DIAGNOSIS — E038 Other specified hypothyroidism: Secondary | ICD-10-CM | POA: Diagnosis not present

## 2020-04-08 DIAGNOSIS — E063 Autoimmune thyroiditis: Secondary | ICD-10-CM | POA: Diagnosis not present

## 2020-04-09 LAB — TSH: TSH: 2.48 mIU/L (ref 0.40–4.50)

## 2020-04-09 LAB — VITAMIN D 25 HYDROXY (VIT D DEFICIENCY, FRACTURES): Vit D, 25-Hydroxy: 70 ng/mL (ref 30–100)

## 2020-04-09 LAB — T4, FREE: Free T4: 1.2 ng/dL (ref 0.8–1.8)

## 2020-04-15 ENCOUNTER — Ambulatory Visit: Payer: BLUE CROSS/BLUE SHIELD | Admitting: Nurse Practitioner

## 2020-04-15 ENCOUNTER — Other Ambulatory Visit: Payer: Self-pay

## 2020-04-15 ENCOUNTER — Encounter: Payer: Self-pay | Admitting: Nurse Practitioner

## 2020-04-15 VITALS — BP 136/91 | HR 61 | Ht 69.0 in | Wt 206.0 lb

## 2020-04-15 DIAGNOSIS — E038 Other specified hypothyroidism: Secondary | ICD-10-CM | POA: Diagnosis not present

## 2020-04-15 DIAGNOSIS — E559 Vitamin D deficiency, unspecified: Secondary | ICD-10-CM | POA: Diagnosis not present

## 2020-04-15 DIAGNOSIS — E063 Autoimmune thyroiditis: Secondary | ICD-10-CM

## 2020-04-15 NOTE — Patient Instructions (Signed)

## 2020-04-15 NOTE — Progress Notes (Signed)
04/15/2020, 10:28 AM           Endocrinology follow-up note   SUBJECTIVE:  April Pennington is a 59 y.o.-year-old female patient being engaged in telehealth for follow-up for hypothyroidism related to Hashimoto's thyroiditis, vitamin D deficiency.   Past Medical History:  Diagnosis Date  . Gallstones   . GERD (gastroesophageal reflux disease)    uses Alka seltzer   Past Surgical History:  Procedure Laterality Date  . BREAST SURGERY     cyst removed from right breast at age 89  . CHOLECYSTECTOMY N/A 05/20/2013   Procedure: LAPAROSCOPIC CHOLECYSTECTOMY WITH INTRAOPERATIVE CHOLANGIOGRAM;  Surgeon: Axel Filler, MD;  Location: [facility] OR;  Service: General;  Laterality: N/A;  . TUBAL LIGATION     Social History   Socioeconomic History  . Marital status: Married    Spouse name: Not on file  . Number of children: Not on file  . Years of education: Not on file  . Highest education level: Not on file  Occupational History  . Not on file  Tobacco Use  . Smoking status: Never Smoker  . Smokeless tobacco: Never Used  Vaping Use  . Vaping Use: Every day  Substance and Sexual Activity  . Alcohol use: No  . Drug use: No  . Sexual activity: Not on file  Other Topics Concern  . Not on file  Social History Narrative   ** Merged History Encounter **       Social Determinants of Health   Financial Resource Strain:   . Difficulty of Paying Living Expenses: Not on file  Food Insecurity:   . Worried About Programme researcher, broadcasting/film/video in the Last Year: Not on file  . Ran Out of Food in the Last Year: Not on file  Transportation Needs:   . Lack of Transportation (Medical): Not on file  . Lack of Transportation (Non-Medical): Not on file  Physical Activity:   . Days of Exercise per Week: Not on file  . Minutes of Exercise per Session: Not on file  Stress:   . Feeling of Stress : Not on file  Social Connections:    . Frequency of Communication with Friends and Family: Not on file  . Frequency of Social Gatherings with Friends and Family: Not on file  . Attends Religious Services: Not on file  . Active Member of Clubs or Organizations: Not on file  . Attends Banker Meetings: Not on file  . Marital Status: Not on file   Outpatient Encounter Medications as of 04/15/2020  Medication Sig  . Cholecalciferol (VITAMIN D3) 125 MCG (5000 UT) CAPS Take 1 capsule (5,000 Units total) by mouth daily.  Marland Kitchen levothyroxine (SYNTHROID) 125 MCG tablet Take 1 tablet (125 mcg total) by mouth daily before breakfast.  . milk thistle 175 MG tablet Take 175 mg by mouth daily.  . [DISCONTINUED] ciprofloxacin (CIPRO) 500 MG tablet Take 1 tablet (500 mg total) by mouth 2 (two) times daily.   No facility-administered encounter medications on file as of 04/15/2020.   ALLERGIES: Allergies  Allergen Reactions  . Hydrocodone Nausea And Vomiting  . Hydrocodone   . Oxycodone Nausea And Vomiting  . Vicodin [Hydrocodone-Acetaminophen] Nausea And Vomiting  . Vicodin [Hydrocodone-Acetaminophen]    VACCINATION STATUS: Immunization History  Administered Date(s) Administered  . Tdap 09/21/2016     HPI  Thyroid Problem Presents for follow-up (diagnosed  with hypothyroidism at approximate age of 52 years which was recently confirmed to be related to her Hashimoto's thyroiditis.  She has + FH of thyroid disorders in: Her mother who is taking thyroid hormone supplements. No FH of thyroid cancer. ) visit. Patient reports no anxiety, cold intolerance, constipation, depressed mood, diarrhea, fatigue, heat intolerance, leg swelling, palpitations or tremors. The symptoms have been stable.   April Pennington  is a patient with the above medical history.   She is currently on Synthroid 125 mcg p.o. daily before breakfast.  She reports compliance, feeling better, no new complaints today.    She denies feeling nodules in neck,  hoarseness, dysphagia/odynophagia, SOB with lying down.    Review of systems  Constitutional: + Minimally fluctuating body weight,  current Body mass index is 30.42 kg/m. , no fatigue, no subjective hyperthermia, no subjective hypothermia Eyes: no blurry vision, no xerophthalmia ENT: no sore throat, no nodules palpated in throat, no dysphagia/odynophagia, no hoarseness Cardiovascular: no chest pain, no shortness of breath, no palpitations, no leg swelling Respiratory: no cough, no shortness of breath Gastrointestinal: no nausea/vomiting/diarrhea Musculoskeletal: no muscle/joint aches Skin: no rashes, no hyperemia Neurological: no tremors, no numbness, no tingling, no dizziness Psychiatric: no depression, no anxiety    OBJECTIVE:  BP (!) 136/91 (BP Location: Left Arm, Patient Position: Sitting)   Pulse 61   Ht 5\' 9"  (1.753 m)   Wt 206 lb (93.4 kg)   BMI 30.42 kg/m  Wt Readings from Last 3 Encounters:  04/15/20 206 lb (93.4 kg)  10/13/19 215 lb 9.6 oz (97.8 kg)  05/23/18 212 lb (96.2 kg)    Physical Exam- Limited  Constitutional:  Body mass index is 30.42 kg/m. , not in acute distress, normal state of mind Eyes:  EOMI, no exophthalmos Neck: Supple Thyroid: No gross goiter, (has asymmetric thyroid with L < R) Cardiovascular: RRR, no murmers, rubs, or gallops, no edema Respiratory: Adequate breathing efforts, no crackles, rales, rhonchi, or wheezing Musculoskeletal: no gross deformities, strength intact in all four extremities, no gross restriction of joint movements Skin:  no rashes, no hyperemia Neurological: no tremor with outstretched hands  CMP     Component Value Date/Time   NA 138 03/19/2019 0927   K 4.4 03/19/2019 0927   CL 103 03/19/2019 0927   CO2 31 03/19/2019 0927   GLUCOSE 84 03/19/2019 0927   BUN 16 03/19/2019 0927   CREATININE 0.92 03/19/2019 0927   CALCIUM 9.5 03/19/2019 0927   PROT 6.8 03/19/2019 0927   ALBUMIN 4.5 03/28/2018 1935   AST 19  03/19/2019 0927   ALT 34 (H) 03/19/2019 0927   ALKPHOS 80 03/28/2018 1935   BILITOT 0.6 03/19/2019 0927   GFRNONAA 70 03/19/2019 0927   GFRAA 81 03/19/2019 0927    Lab Results  Component Value Date   TSH 2.48 04/08/2020   TSH 2.13 10/06/2019   TSH 0.95 03/19/2019   TSH 8.05 (H) 11/15/2018   TSH 8.15 (H) 05/15/2018   TSH 9.43 (H) 11/02/2016   FREET4 1.2 04/08/2020   FREET4 1.2 10/06/2019   FREET4 1.3 03/19/2019   FREET4 1.1 11/15/2018   FREET4 1.1 05/15/2018  Right lobe of her thyroid is 4.6 cm, left lobe is 1.5 cm.  No discrete nodules on bilateral thyroid lobes. ----------------------------------------------------------------------------------------------------------------------------------------  ASSESSMENT / PLAN: 1. Hypothyroidism due to Hashimoto's thyroiditis -Her previsit labs are consistent with appropriate hormone replacement.  She is advised to continue  Synthroid 125 mcg p.o. daily before breakfast.   - We discussed about the correct intake of her thyroid hormone, on empty stomach at fasting, with water, separated by at least 30 minutes from breakfast and other medications,  and separated by more than 4 hours from calcium, iron, multivitamins, acid reflux medications (PPIs). -Patient is made aware of the fact that thyroid hormone replacement is needed for life, dose to be adjusted by periodic monitoring of thyroid function tests.  2.  Asymmetric thyroid -Due to palpable thyromegaly, she had baseline thyroid ultrasound on 04/01/2018.  Her thyroid ultrasound is remarkable for asymmetric thyroid with no discrete nodular lesions. No need for dedicated follow up at this time.     Follow Up Plan: Return in about 6 months (around 10/13/2020) for Thyroid follow up, Previsit labs.       - Time spent on this patient care encounter:  20 minutes of which 50% was spent in  counseling and the rest reviewing  her current and  previous labs / studies and medications  doses  and developing a plan for long term care. Alfredia Client  participated in the discussions, expressed understanding, and voiced agreement with the above plans.  All questions were answered to her satisfaction. she is encouraged to contact clinic should she have any questions or concerns prior to her return visit.     Ronny Bacon, [facility] [address] [place], [place] [address] Phone: [phone] Fax: [phone]   04/15/2020, 10:28 AM

## 2020-05-10 ENCOUNTER — Other Ambulatory Visit: Payer: Self-pay | Admitting: "Endocrinology

## 2020-05-25 DIAGNOSIS — Z6831 Body mass index (BMI) 31.0-31.9, adult: Secondary | ICD-10-CM | POA: Diagnosis not present

## 2020-05-25 DIAGNOSIS — R350 Frequency of micturition: Secondary | ICD-10-CM | POA: Diagnosis not present

## 2020-05-25 DIAGNOSIS — E6609 Other obesity due to excess calories: Secondary | ICD-10-CM | POA: Diagnosis not present

## 2020-05-25 DIAGNOSIS — Z1331 Encounter for screening for depression: Secondary | ICD-10-CM | POA: Diagnosis not present

## 2020-05-25 DIAGNOSIS — K5792 Diverticulitis of intestine, part unspecified, without perforation or abscess without bleeding: Secondary | ICD-10-CM | POA: Diagnosis not present

## 2020-08-13 DIAGNOSIS — Z01818 Encounter for other preprocedural examination: Secondary | ICD-10-CM | POA: Diagnosis not present

## 2020-08-13 DIAGNOSIS — Z8719 Personal history of other diseases of the digestive system: Secondary | ICD-10-CM | POA: Diagnosis not present

## 2020-08-13 DIAGNOSIS — K219 Gastro-esophageal reflux disease without esophagitis: Secondary | ICD-10-CM | POA: Diagnosis not present

## 2020-10-12 DIAGNOSIS — E038 Other specified hypothyroidism: Secondary | ICD-10-CM | POA: Diagnosis not present

## 2020-10-12 DIAGNOSIS — E063 Autoimmune thyroiditis: Secondary | ICD-10-CM | POA: Diagnosis not present

## 2020-10-13 ENCOUNTER — Other Ambulatory Visit: Payer: Self-pay

## 2020-10-13 ENCOUNTER — Encounter: Payer: Self-pay | Admitting: Nurse Practitioner

## 2020-10-13 ENCOUNTER — Ambulatory Visit: Payer: BLUE CROSS/BLUE SHIELD | Admitting: Nurse Practitioner

## 2020-10-13 VITALS — BP 135/85 | HR 80 | Ht 69.0 in | Wt 206.0 lb

## 2020-10-13 DIAGNOSIS — E038 Other specified hypothyroidism: Secondary | ICD-10-CM

## 2020-10-13 DIAGNOSIS — L659 Nonscarring hair loss, unspecified: Secondary | ICD-10-CM

## 2020-10-13 DIAGNOSIS — E559 Vitamin D deficiency, unspecified: Secondary | ICD-10-CM | POA: Diagnosis not present

## 2020-10-13 DIAGNOSIS — E063 Autoimmune thyroiditis: Secondary | ICD-10-CM

## 2020-10-13 LAB — TSH: TSH: 0.4 u[IU]/mL — ABNORMAL LOW (ref 0.450–4.500)

## 2020-10-13 LAB — T4, FREE: Free T4: 1.32 ng/dL (ref 0.82–1.77)

## 2020-10-13 MED ORDER — LEVOTHYROXINE SODIUM 125 MCG PO TABS: ORAL_TABLET | ORAL | 3 refills | Status: DC

## 2020-10-13 NOTE — Progress Notes (Signed)
10/13/2020, 10:10 AM           Endocrinology follow-up note   SUBJECTIVE:  April Pennington is a 60 y.o.-year-old female patient being engaged in telehealth for follow-up for hypothyroidism related to Hashimoto's thyroiditis, vitamin D deficiency.   Past Medical History:  Diagnosis Date  . Gallstones   . GERD (gastroesophageal reflux disease)    uses Alka seltzer   Past Surgical History:  Procedure Laterality Date  . BREAST SURGERY     cyst removed from right breast at age 76  . CHOLECYSTECTOMY N/A 05/20/2013   Procedure: LAPAROSCOPIC CHOLECYSTECTOMY WITH INTRAOPERATIVE CHOLANGIOGRAM;  Surgeon: Axel Filler, MD;  Location: [facility] OR;  Service: General;  Laterality: N/A;  . TUBAL LIGATION     Social History   Socioeconomic History  . Marital status: Married    Spouse name: Not on file  . Number of children: Not on file  . Years of education: Not on file  . Highest education level: Not on file  Occupational History  . Not on file  Tobacco Use  . Smoking status: Never Smoker  . Smokeless tobacco: Never Used  Vaping Use  . Vaping Use: Every day  Substance and Sexual Activity  . Alcohol use: No  . Drug use: No  . Sexual activity: Not on file  Other Topics Concern  . Not on file  Social History Narrative   ** Merged History Encounter **       Social Determinants of Health   Financial Resource Strain: Not on file  Food Insecurity: Not on file  Transportation Needs: Not on file  Physical Activity: Not on file  Stress: Not on file  Social Connections: Not on file   Outpatient Encounter Medications as of 10/13/2020  Medication Sig  . Cholecalciferol (VITAMIN D3) 125 MCG (5000 UT) CAPS Take 1 capsule (5,000 Units total) by mouth daily.  Marland Kitchen levothyroxine (SYNTHROID) 125 MCG tablet TAKE 1 TABLET(125 MCG) BY MOUTH DAILY BEFORE BREAKFAST  . milk thistle 175 MG tablet Take 175 mg by mouth daily.   No  facility-administered encounter medications on file as of 10/13/2020.   ALLERGIES: Allergies  Allergen Reactions  . Hydrocodone Nausea And Vomiting  . Hydrocodone   . Oxycodone Nausea And Vomiting  . Vicodin [Hydrocodone-Acetaminophen] Nausea And Vomiting  . Vicodin [Hydrocodone-Acetaminophen]    VACCINATION STATUS: Immunization History  Administered Date(s) Administered  . Tdap 09/21/2016     HPI  Thyroid Problem Presents for follow-up (diagnosed  with hypothyroidism at approximate age of 52 years which was recently confirmed to be related to her Hashimoto's thyroiditis.  She has + FH of thyroid disorders in: Her mother who is taking thyroid hormone supplements. No FH of thyroid cancer. ) visit. Symptoms include anxiety, depressed mood and hair loss. Patient reports no cold intolerance, constipation, diarrhea, fatigue, heat intolerance, leg swelling, palpitations, tremors, weight gain or weight loss. The symptoms have been worsening.   April Pennington  is a patient with the above medical history.   She is currently on Synthroid 125 mcg p.o.  daily before breakfast.    She denies feeling nodules in neck, hoarseness, dysphagia/odynophagia, SOB with lying down.    Review of systems  Constitutional: + Minimally fluctuating body weight,  current Body mass index is 30.42 kg/m. , no fatigue, no subjective hyperthermia, no subjective hypothermia Eyes: no blurry vision, no xerophthalmia ENT: no sore throat, no nodules palpated in throat, no dysphagia/odynophagia, no hoarseness Cardiovascular: no chest pain, no shortness of breath, no palpitations, no leg swelling Respiratory: no cough, no shortness of breath Gastrointestinal: no nausea/vomiting/diarrhea Musculoskeletal: no muscle/joint aches Skin: no rashes, no hyperemia, + hair loss (had covid back in January and thinks hair loss could be related to this) Neurological: no tremors, no numbness, no tingling, no dizziness Psychiatric: +  depression and anxiety, reports multiple stressors    OBJECTIVE:  BP 135/85   Pulse 80   Ht 5\' 9"  (1.753 m)   Wt 206 lb (93.4 kg)   BMI 30.42 kg/m  Wt Readings from Last 3 Encounters:  10/13/20 206 lb (93.4 kg)  04/15/20 206 lb (93.4 kg)  10/13/19 215 lb 9.6 oz (97.8 kg)    Physical Exam- Limited  Constitutional:  Body mass index is 30.42 kg/m. , not in acute distress, normal state of mind Eyes:  EOMI, no exophthalmos Neck: Supple Thyroid: No gross goiter, (has asymmetric thyroid with L < R) Cardiovascular: RRR, no murmers, rubs, or gallops, no edema Respiratory: Adequate breathing efforts, no crackles, rales, rhonchi, or wheezing Musculoskeletal: no gross deformities, strength intact in all four extremities, no gross restriction of joint movements Skin:  no rashes, no hyperemia Neurological: no tremor with outstretched hands    CMP     Component Value Date/Time   NA 138 03/19/2019 0927   K 4.4 03/19/2019 0927   CL 103 03/19/2019 0927   CO2 31 03/19/2019 0927   GLUCOSE 84 03/19/2019 0927   BUN 16 03/19/2019 0927   CREATININE 0.92 03/19/2019 0927   CALCIUM 9.5 03/19/2019 0927   PROT 6.8 03/19/2019 0927   ALBUMIN 4.5 03/28/2018 1935   AST 19 03/19/2019 0927   ALT 34 (H) 03/19/2019 0927   ALKPHOS 80 03/28/2018 1935   BILITOT 0.6 03/19/2019 0927   GFRNONAA 70 03/19/2019 0927   GFRAA 81 03/19/2019 0927    Lab Results  Component Value Date   TSH 0.400 (L) 10/12/2020   TSH 2.48 04/08/2020   TSH 2.13 10/06/2019   TSH 0.95 03/19/2019   TSH 8.05 (H) 11/15/2018   TSH 8.15 (H) 05/15/2018   TSH 9.43 (H) 11/02/2016   FREET4 1.32 10/12/2020   FREET4 1.2 04/08/2020   FREET4 1.2 10/06/2019   FREET4 1.3 03/19/2019   FREET4 1.1 11/15/2018   FREET4 1.1 05/15/2018      Right lobe of her thyroid is 4.6 cm, left lobe is 1.5 cm.  No discrete nodules on bilateral thyroid  lobes. ----------------------------------------------------------------------------------------------------------------------------------------  ASSESSMENT / PLAN: 1. Hypothyroidism due to Hashimoto's thyroiditis -Her previsit labs are consistent with appropriate hormone replacement.  She is advised to continue Synthroid 125 mcg p.o. daily before breakfast.    - We discussed about the correct intake of her thyroid hormone, on empty stomach at fasting, with water, separated by at least 30 minutes from breakfast and other medications,  and separated by more than 4 hours from calcium, iron, multivitamins, acid reflux medications (PPIs). -Patient is made aware of the fact that thyroid hormone replacement is needed for life, dose to be adjusted by periodic monitoring of thyroid function tests.  2.  Asymmetric thyroid -Due to palpable thyromegaly, she had baseline thyroid ultrasound on 04/01/2018.  Her thyroid ultrasound is remarkable for asymmetric thyroid with no discrete nodular lesions. No need for dedicated follow up at this time.  3. Hair loss -States she thinks she had COVID back in January but was never tested to confirm.  Thinks her hair loss could be caused by this.  She has not had blood work from her PCP in quite some time.  Will check A1c, vitamin D, lipid panel, CMP and recheck TFTs prior to next visit.    I spent 33 minutes in the care of the patient today including review of labs from Thyroid Function, CMP, and other relevant labs ; imaging/biopsy records (current and previous including abstractions from other facilities); face-to-face time discussing  her lab results and symptoms, medications doses, her options of short and long term treatment based on the latest standards of care / guidelines;   and documenting the encounter.  Alfredia Client  participated in the discussions, expressed understanding, and voiced agreement with the above plans.  All questions were answered to her  satisfaction. she is encouraged to contact clinic should she have any questions or concerns prior to her return visit.   Follow Up Plan: Return in about 6 months (around 04/15/2021) for Thyroid follow up, Previsit labs.     Ronny Bacon, [facility] [address] [place], [place] [address] Phone: [phone] Fax: [phone]   10/13/2020, 10:10 AM

## 2020-10-13 NOTE — Patient Instructions (Signed)

## 2020-10-25 ENCOUNTER — Telehealth: Payer: Self-pay | Admitting: Nurse Practitioner

## 2020-10-25 NOTE — Telephone Encounter (Signed)
Pt states her and whitney were discussing starting her on a blood pressure medication and is interesting in starting that and requesting a call back.

## 2020-10-25 NOTE — Telephone Encounter (Signed)
Left msg w/ pt that we are sending her the msg but Whitney would not be in til tomorrow morning.

## 2020-10-28 NOTE — Telephone Encounter (Signed)
Left voicemail to return my call

## 2020-11-03 ENCOUNTER — Other Ambulatory Visit: Payer: Self-pay | Admitting: Nurse Practitioner

## 2020-11-04 ENCOUNTER — Encounter: Payer: Self-pay | Admitting: Nurse Practitioner

## 2020-11-04 DIAGNOSIS — I10 Essential (primary) hypertension: Secondary | ICD-10-CM

## 2020-11-04 MED ORDER — LISINOPRIL 5 MG PO TABS: 5.0000 mg | ORAL_TABLET | Freq: Every day | ORAL | 3 refills | Status: DC

## 2020-11-05 DIAGNOSIS — K5730 Diverticulosis of large intestine without perforation or abscess without bleeding: Secondary | ICD-10-CM | POA: Diagnosis not present

## 2020-11-05 DIAGNOSIS — D122 Benign neoplasm of ascending colon: Secondary | ICD-10-CM | POA: Diagnosis not present

## 2020-11-05 DIAGNOSIS — Z1211 Encounter for screening for malignant neoplasm of colon: Secondary | ICD-10-CM | POA: Diagnosis not present

## 2020-11-05 DIAGNOSIS — K648 Other hemorrhoids: Secondary | ICD-10-CM | POA: Diagnosis not present

## 2020-12-22 ENCOUNTER — Other Ambulatory Visit: Payer: Self-pay | Admitting: Nurse Practitioner

## 2020-12-22 DIAGNOSIS — I10 Essential (primary) hypertension: Secondary | ICD-10-CM

## 2020-12-22 MED ORDER — LISINOPRIL 20 MG PO TABS: 20.0000 mg | ORAL_TABLET | Freq: Every day | ORAL | 3 refills | Status: DC

## 2020-12-22 NOTE — Progress Notes (Signed)
Pt reports BP is still remaining elevated despite increasing her Lisinopril to 10 mg po daily.  Will increase to 20 mg po daily.  New script sent to pharmacy.

## 2021-04-19 ENCOUNTER — Ambulatory Visit: Payer: BLUE CROSS/BLUE SHIELD | Admitting: Nurse Practitioner

## 2021-04-26 DIAGNOSIS — L659 Nonscarring hair loss, unspecified: Secondary | ICD-10-CM | POA: Diagnosis not present

## 2021-04-26 DIAGNOSIS — E063 Autoimmune thyroiditis: Secondary | ICD-10-CM | POA: Diagnosis not present

## 2021-04-26 DIAGNOSIS — E038 Other specified hypothyroidism: Secondary | ICD-10-CM | POA: Diagnosis not present

## 2021-04-26 DIAGNOSIS — E559 Vitamin D deficiency, unspecified: Secondary | ICD-10-CM | POA: Diagnosis not present

## 2021-04-27 LAB — VITAMIN D 25 HYDROXY (VIT D DEFICIENCY, FRACTURES): Vit D, 25-Hydroxy: 35.2 ng/mL (ref 30.0–100.0)

## 2021-04-27 LAB — COMPREHENSIVE METABOLIC PANEL
ALT: 43 IU/L — ABNORMAL HIGH (ref 0–32)
AST: 29 IU/L (ref 0–40)
Albumin/Globulin Ratio: 1.9 (ref 1.2–2.2)
Albumin: 4.6 g/dL (ref 3.8–4.9)
Alkaline Phosphatase: 83 IU/L (ref 44–121)
BUN/Creatinine Ratio: 15 (ref 9–23)
BUN: 12 mg/dL (ref 6–24)
Bilirubin Total: 0.4 mg/dL (ref 0.0–1.2)
CO2: 21 mmol/L (ref 20–29)
Calcium: 9.2 mg/dL (ref 8.7–10.2)
Chloride: 105 mmol/L (ref 96–106)
Creatinine, Ser: 0.82 mg/dL (ref 0.57–1.00)
Globulin, Total: 2.4 g/dL (ref 1.5–4.5)
Glucose: 83 mg/dL (ref 70–99)
Potassium: 4.3 mmol/L (ref 3.5–5.2)
Sodium: 142 mmol/L (ref 134–144)
Total Protein: 7 g/dL (ref 6.0–8.5)
eGFR: 83 mL/min/{1.73_m2} (ref >59)

## 2021-04-27 LAB — LIPID PANEL
Chol/HDL Ratio: 4.7 ratio — ABNORMAL HIGH (ref 0.0–4.4)
Cholesterol, Total: 217 mg/dL — ABNORMAL HIGH (ref 100–199)
HDL: 46 mg/dL (ref >39)
LDL Chol Calc (NIH): 154 mg/dL — ABNORMAL HIGH (ref 0–99)
Triglycerides: 93 mg/dL (ref 0–149)
VLDL Cholesterol Cal: 17 mg/dL (ref 5–40)

## 2021-04-27 LAB — T4, FREE: Free T4: 1.2 ng/dL (ref 0.82–1.77)

## 2021-04-27 LAB — HEMOGLOBIN A1C
Est. average glucose Bld gHb Est-mCnc: 111 mg/dL
Hgb A1c MFr Bld: 5.5 % (ref 4.8–5.6)

## 2021-04-27 LAB — TSH: TSH: 2.16 u[IU]/mL (ref 0.450–4.500)

## 2021-05-03 ENCOUNTER — Encounter: Payer: Self-pay | Admitting: Nurse Practitioner

## 2021-05-03 ENCOUNTER — Other Ambulatory Visit: Payer: Self-pay

## 2021-05-03 ENCOUNTER — Ambulatory Visit (INDEPENDENT_AMBULATORY_CARE_PROVIDER_SITE_OTHER): Payer: BLUE CROSS/BLUE SHIELD | Admitting: Nurse Practitioner

## 2021-05-03 VITALS — BP 132/71 | HR 78 | Ht 69.0 in | Wt 216.0 lb

## 2021-05-03 DIAGNOSIS — E063 Autoimmune thyroiditis: Secondary | ICD-10-CM

## 2021-05-03 DIAGNOSIS — E559 Vitamin D deficiency, unspecified: Secondary | ICD-10-CM

## 2021-05-03 DIAGNOSIS — E038 Other specified hypothyroidism: Secondary | ICD-10-CM

## 2021-05-03 DIAGNOSIS — I10 Essential (primary) hypertension: Secondary | ICD-10-CM

## 2021-05-03 MED ORDER — LEVOTHYROXINE SODIUM 137 MCG PO TABS: 137.0000 ug | ORAL_TABLET | Freq: Every day | ORAL | 1 refills | Status: DC

## 2021-05-03 NOTE — Progress Notes (Signed)
05/03/2021, 11:09 AM           Endocrinology follow-up note   SUBJECTIVE:  April Pennington is a 60 y.o.-year-old female patient being engaged for follow-up for hypothyroidism related to Hashimoto's thyroiditis, vitamin D deficiency.   Past Medical History:  Diagnosis Date   Gallstones    GERD (gastroesophageal reflux disease)    uses Alka seltzer   Past Surgical History:  Procedure Laterality Date   BREAST SURGERY     cyst removed from right breast at age 43   CHOLECYSTECTOMY N/A 05/20/2013   Procedure: LAPAROSCOPIC CHOLECYSTECTOMY WITH INTRAOPERATIVE CHOLANGIOGRAM;  Surgeon: Axel Filler, MD;  Location: [facility] OR;  Service: General;  Laterality: N/A;   TUBAL LIGATION     Social History   Socioeconomic History   Marital status: Married    Spouse name: Not on file   Number of children: Not on file   Years of education: Not on file   Highest education level: Not on file  Occupational History   Not on file  Tobacco Use   Smoking status: Never   Smokeless tobacco: Never  Vaping Use   Vaping Use: Every day  Substance and Sexual Activity   Alcohol use: No   Drug use: No   Sexual activity: Not on file  Other Topics Concern   Not on file  Social History Narrative   ** Merged History Encounter **       Social Determinants of Health   Financial Resource Strain: Not on file  Food Insecurity: Not on file  Transportation Needs: Not on file  Physical Activity: Not on file  Stress: Not on file  Social Connections: Not on file   Outpatient Encounter Medications as of 05/03/2021  Medication Sig   lisinopril (ZESTRIL) 20 MG tablet Take 1 tablet (20 mg total) by mouth daily.   [DISCONTINUED] levothyroxine (SYNTHROID) 125 MCG tablet TAKE 1 TABLET(125 MCG) BY MOUTH DAILY BEFORE BREAKFAST   Cholecalciferol (VITAMIN D3) 125 MCG (5000 UT) CAPS Take 1 capsule (5,000 Units total) by mouth daily. (Patient  not taking: Reported on 05/03/2021)   levothyroxine (SYNTHROID) 137 MCG tablet Take 1 tablet (137 mcg total) by mouth daily before breakfast.   [DISCONTINUED] milk thistle 175 MG tablet Take 175 mg by mouth daily. (Patient not taking: Reported on 05/03/2021)   No facility-administered encounter medications on file as of 05/03/2021.   ALLERGIES: Allergies  Allergen Reactions   Hydrocodone Nausea And Vomiting   Hydrocodone    Oxycodone Nausea And Vomiting   Vicodin [Hydrocodone-Acetaminophen] Nausea And Vomiting   Vicodin [Hydrocodone-Acetaminophen]    VACCINATION STATUS: Immunization History  Administered Date(s) Administered   Tdap 09/21/2016     HPI  Thyroid Problem Presents for follow-up (diagnosed  with hypothyroidism at approximate age of 52 years which was recently confirmed to be related to her Hashimoto's thyroiditis.  She has + FH of thyroid disorders in: Her mother who is taking thyroid hormone supplements. No FH of thyroid cancer. ) visit. Symptoms include anxiety, depressed mood and weight gain. Patient reports no cold  intolerance, constipation, diarrhea, fatigue, hair loss, heat intolerance, leg swelling, palpitations, tremors or weight loss. The symptoms have been worsening.   MURIAH Pennington is a patient with the above medical history.   She is currently on Synthroid 125 mcg p.o. daily before breakfast.    She denies feeling nodules in neck, hoarseness, dysphagia/odynophagia, SOB with lying down.    Review of systems  Constitutional: + Minimally fluctuating body weight,  current Body mass index is 31.9 kg/m. , + fatigue, no subjective hyperthermia, no subjective hypothermia Eyes: no blurry vision, no xerophthalmia ENT: no sore throat, no nodules palpated in throat, no dysphagia/odynophagia, no hoarseness Cardiovascular: no chest pain, no shortness of breath, no palpitations, no leg swelling Respiratory: no cough, no shortness of breath Gastrointestinal: no  nausea/vomiting/diarrhea Musculoskeletal: no muscle/joint aches Skin: no rashes, no hyperemia, + hair loss- much improved Neurological: no tremors, no numbness, no tingling, no dizziness Psychiatric: + depression and anxiety, reports multiple stressors  ------------------------------------------------------------------------------------------------------------------------------  OBJECTIVE:  BP 132/71   Pulse 78   Ht 5\' 9"  (1.753 m)   Wt 216 lb (98 kg)   BMI 31.90 kg/m  Wt Readings from Last 3 Encounters:  05/03/21 216 lb (98 kg)  10/13/20 206 lb (93.4 kg)  04/15/20 206 lb (93.4 kg)   BP Readings from Last 3 Encounters:  05/03/21 132/71  10/13/20 135/85  04/15/20 (!) 136/91    Physical Exam- Limited  Constitutional:  Current Body mass index is 31.9 kg/m., not in acute distress, normal state of mind Eyes:  EOMI, no exophthalmos Neck: Supple Thyroid: No gross goiter, (has asymmetric thyroid with L < R) Cardiovascular: RRR, no murmers, rubs, or gallops, no edema Respiratory: Adequate breathing efforts, no crackles, rales, rhonchi, or wheezing Musculoskeletal: no gross deformities, strength intact in all four extremities, no gross restriction of joint movements Skin:  no rashes, no hyperemia Neurological: no tremor with outstretched hands    CMP     Component Value Date/Time   NA 142 04/26/2021 1133   K 4.3 04/26/2021 1133   CL 105 04/26/2021 1133   CO2 21 04/26/2021 1133   GLUCOSE 83 04/26/2021 1133   GLUCOSE 84 03/19/2019 0927   BUN 12 04/26/2021 1133   CREATININE 0.82 04/26/2021 1133   CREATININE 0.92 03/19/2019 0927   CALCIUM 9.2 04/26/2021 1133   PROT 7.0 04/26/2021 1133   ALBUMIN 4.6 04/26/2021 1133   AST 29 04/26/2021 1133   ALT 43 (H) 04/26/2021 1133   ALKPHOS 83 04/26/2021 1133   BILITOT 0.4 04/26/2021 1133   GFRNONAA 70 03/19/2019 0927   GFRAA 81 03/19/2019 0927    Lab Results  Component Value Date   TSH 2.160 04/26/2021   TSH 0.400 (L)  10/12/2020   TSH 2.48 04/08/2020   TSH 2.13 10/06/2019   TSH 0.95 03/19/2019   TSH 8.05 (H) 11/15/2018   TSH 8.15 (H) 05/15/2018   TSH 9.43 (H) 11/02/2016   FREET4 1.20 04/26/2021   FREET4 1.32 10/12/2020   FREET4 1.2 04/08/2020   FREET4 1.2 10/06/2019   FREET4 1.3 03/19/2019   FREET4 1.1 11/15/2018   FREET4 1.1 05/15/2018      Right lobe of her thyroid is 4.6 cm, left lobe is 1.5 cm.  No discrete nodules on bilateral thyroid lobes. ----------------------------------------------------------------------------------------------------------------------------------  ASSESSMENT / PLAN: 1. Hypothyroidism due to Hashimoto's thyroiditis -Her previsit labs are consistent with appropriate hormone replacement, however, given her symptoms she could tolerate a bit more for the colder months.  She is advised  to increase her Levothyroxine to 137 mcg po daily before breakfast.  We did go over symptoms of over-replacement and she is advised to reach out if she experiences those.   - We discussed about the correct intake of her thyroid hormone, on empty stomach at fasting, with water, separated by at least 30 minutes from breakfast and other medications,  and separated by more than 4 hours from calcium, iron, multivitamins, acid reflux medications (PPIs). -Patient is made aware of the fact that thyroid hormone replacement is needed for life, dose to be adjusted by periodic monitoring of thyroid function tests.  2.  Asymmetric thyroid -Due to palpable thyromegaly, she had baseline thyroid ultrasound on 04/01/2018.  Her thyroid ultrasound is remarkable for asymmetric thyroid with no discrete nodular lesions. No need for dedicated follow up at this time.  3. Hypertension Her blood pressure is controlled to target.  She is advised to continue Lisinopril 20 mg po daily with breakfast.  4. Hyperlipidemia Her recent lipid panel from 04/26/21 shows uncontrolled LDL of 154.  She is not currently on any lipid  lowering medications.  She does not eat dairy or eggs in excess but does eat quite a bit of red meat.  We discussed recommended dietary changes and increasing exercise to avoid needing medications in the future.      I spent 30 minutes in the care of the patient today including review of labs from CMP, Lipids, Thyroid Function, Hematology (current and previous including abstractions from other facilities); face-to-face time discussing  her blood glucose readings/logs, discussing hypoglycemia and hyperglycemia episodes and symptoms, medications doses, her options of short and long term treatment based on the latest standards of care / guidelines;  discussion about incorporating lifestyle medicine;  and documenting the encounter.    Please refer to Patient Instructions for Blood Glucose Monitoring and Insulin/Medications Dosing Guide"  in media tab for additional information. Please  also refer to " Patient Self Inventory" in the Media  tab for reviewed elements of pertinent patient history.  Alfredia Client participated in the discussions, expressed understanding, and voiced agreement with the above plans.  All questions were answered to her satisfaction. she is encouraged to contact clinic should she have any questions or concerns prior to her return visit.   Follow Up Plan: Return in about 6 months (around 10/31/2021) for Thyroid follow up, Previsit labs.     Ronny Bacon, [facility] [address] [place], [place] [address] Phone: [phone] Fax: [phone]   05/03/2021, 11:09 AM

## 2021-05-03 NOTE — Patient Instructions (Signed)

## 2021-06-07 ENCOUNTER — Other Ambulatory Visit: Payer: Self-pay

## 2021-06-07 ENCOUNTER — Ambulatory Visit
Admission: EM | Admit: 2021-06-07 | Discharge: 2021-06-07 | Disposition: A | Discharge status: Home / Self Care | Payer: BC Managed Care – PPO | Attending: Family Medicine | Admitting: Family Medicine

## 2021-06-07 DIAGNOSIS — J039 Acute tonsillitis, unspecified: Secondary | ICD-10-CM | POA: Diagnosis not present

## 2021-06-07 DIAGNOSIS — J029 Acute pharyngitis, unspecified: Secondary | ICD-10-CM

## 2021-06-07 LAB — POCT RAPID STREP A (OFFICE): Rapid Strep A Screen: NEGATIVE

## 2021-06-07 MED ORDER — LIDOCAINE VISCOUS HCL 2 % MT SOLN: 10.0000 mL | OROMUCOSAL | 0 refills | Status: DC | PRN

## 2021-06-07 MED ORDER — AMOXICILLIN 875 MG PO TABS: 875.0000 mg | ORAL_TABLET | Freq: Two times a day (BID) | ORAL | 0 refills | Status: DC

## 2021-06-07 NOTE — ED Triage Notes (Signed)
Patient states her throat is so sore and swollen that started about 3 am Sunday morning.   Patient states this pain in going from her throat and spreading up into both ears.   She states she only has the symptoms for the throat nothing else.   Patient states she tried gargling warm salt water and it calmed down the symptoms some.  Denies Fever

## 2021-06-07 NOTE — ED Provider Notes (Signed)
[facility]    CSN: [id number] Arrival date & time: 06/07/21  0907      History   Chief Complaint Chief Complaint  Patient presents with   Sore Throat    Sore throat and earache    HPI April Pennington is a 60 y.o. female.   Presenting today with 2 to 3-day history of progressively worsening sore, swollen throat that feels like a burning sensation.  She denies difficulty swallowing, difficulty breathing, fever, chills, body aches, congestion, cough, abdominal pain, nausea vomiting or diarrhea.  No known sick contacts recently.  Trying salt water gargles and children's Mucinex with no relief.   Past Medical History:  Diagnosis Date   Gallstones    GERD (gastroesophageal reflux disease)    uses Alka seltzer    Patient Active Problem List   Diagnosis Date Noted   Hypothyroidism 09/21/2016   Hyperlipidemia 09/21/2016    Past Surgical History:  Procedure Laterality Date   BREAST SURGERY     cyst removed from right breast at age 89   CHOLECYSTECTOMY N/A 05/20/2013   Procedure: LAPAROSCOPIC CHOLECYSTECTOMY WITH INTRAOPERATIVE CHOLANGIOGRAM;  Surgeon: Axel Filler, MD;  Location: [facility] OR;  Service: General;  Laterality: N/A;   TUBAL LIGATION      OB History   No obstetric history on file.      Home Medications    Prior to Admission medications   Medication Sig Start Date End Date Taking? Authorizing Provider  amoxicillin (AMOXIL) 875 MG tablet Take 1 tablet (875 mg total) by mouth 2 (two) times daily. 06/07/21  Yes Particia Nearing, PA-C  lidocaine (XYLOCAINE) 2 % solution Use as directed 10 mLs in the mouth or throat every 3 (three) hours as needed for mouth pain. 06/07/21  Yes Particia Nearing, PA-C  Cholecalciferol (VITAMIN D3) 125 MCG (5000 UT) CAPS Take 1 capsule (5,000 Units total) by mouth daily. Patient not taking: Reported on 05/03/2021 03/25/19   Roma Kayser, MD  levothyroxine (SYNTHROID) 137 MCG tablet Take 1 tablet (137  mcg total) by mouth daily before breakfast. 05/03/21   Dani Gobble, NP  lisinopril (ZESTRIL) 20 MG tablet Take 1 tablet (20 mg total) by mouth daily. 12/22/20   Dani Gobble, NP    Family History No family history on file.  Social History Social History   Tobacco Use   Smoking status: Former    Packs/day: 0.00    Types: Cigarettes    Quit date: 02/11/2012    Years since quitting: 9.3   Smokeless tobacco: Never  Vaping Use   Vaping Use: Every day   Substances: Nicotine  Substance Use Topics   Alcohol use: No   Drug use: No     Allergies   Hydrocodone, Hydrocodone, Oxycodone, Vicodin [hydrocodone-acetaminophen], and Vicodin [hydrocodone-acetaminophen]   Review of Systems Review of Systems Per HPI  Physical Exam Triage Vital Signs ED Triage Vitals  Enc Vitals Group     BP 06/07/21 1144 (!) 146/96     Pulse Rate 06/07/21 1144 89     Resp 06/07/21 1144 18     Temp 06/07/21 1144 98 F (36.7 C)     Temp Source 06/07/21 1144 Oral     SpO2 06/07/21 1144 91 %     Weight --      Height --      Head Circumference --      Peak Flow --      Pain Score 06/07/21 1139 9  Pain Loc --      Pain Edu? --      Excl. in GC? --    No data found.  Updated Vital Signs BP (!) 146/96 (BP Location: Right Arm) Comment: Patient states she has not took her Blood Pressure meds in 2 days   Pulse 89    Temp 98 F (36.7 C) (Oral)    Resp 18    SpO2 91%   Visual Acuity Right Eye Distance:   Left Eye Distance:   Bilateral Distance:    Right Eye Near:   Left Eye Near:    Bilateral Near:     Physical Exam Vitals and nursing note reviewed.  Constitutional:      Appearance: Normal appearance.  HENT:     Head: Atraumatic.     Right Ear: Tympanic membrane and external ear normal.     Left Ear: Tympanic membrane and external ear normal.     Nose: Nose normal.     Mouth/Throat:     Mouth: Mucous membranes are moist.     Pharynx: Oropharyngeal exudate and posterior  oropharyngeal erythema present.     Comments: Bilateral tonsillar erythema, edema, mild exudates.  Uvula midline, oral airway patent Eyes:     Extraocular Movements: Extraocular movements intact.     Conjunctiva/sclera: Conjunctivae normal.  Cardiovascular:     Rate and Rhythm: Normal rate and regular rhythm.     Heart sounds: Normal heart sounds.  Pulmonary:     Effort: Pulmonary effort is normal.     Breath sounds: Normal breath sounds. No wheezing.  Musculoskeletal:        General: Normal range of motion.     Cervical back: Normal range of motion and neck supple.  Lymphadenopathy:     Cervical: Cervical adenopathy present.  Skin:    General: Skin is warm and dry.  Neurological:     Mental Status: She is alert and oriented to person, place, and time.  Psychiatric:        Mood and Affect: Mood normal.        Thought Content: Thought content normal.   UC Treatments / Results  Labs (all labs ordered are listed, but only abnormal results are displayed) Labs Reviewed  CULTURE, GROUP A STREP [facility])  POCT RAPID STREP A (OFFICE)   EKG  Radiology No results found.  Procedures Procedures (including critical care time)  Medications Ordered in UC Medications - No data to display  Initial Impression / Assessment and Plan / UC Course  I have reviewed the triage vital signs and the nursing notes.  Pertinent labs & imaging results that were available during my care of the patient were reviewed by me and considered in my medical decision making (see chart for details).     Rapid strep negative, however given severity of symptoms and exam findings will treat with amoxicillin for suspected bacterial tonsillitis and await throat culture.  Declines COVID and flu testing, home COVID test was negative per patient.  Supportive home care and return precautions reviewed.  Final Clinical Impressions(s) / UC Diagnoses   Final diagnoses:  Sore throat  Acute tonsillitis, unspecified  etiology   Discharge Instructions   None    ED Prescriptions     Medication Sig Dispense Auth. Provider   amoxicillin (AMOXIL) 875 MG tablet Take 1 tablet (875 mg total) by mouth 2 (two) times daily. 20 tablet Particia Nearing, PA-C   lidocaine (XYLOCAINE) 2 % solution Use as directed 10  mLs in the mouth or throat every 3 (three) hours as needed for mouth pain. 100 mL Particia Nearing, [place]      PDMP not reviewed this encounter.   Particia Nearing, [place] 06/07/21 1235

## 2021-06-10 LAB — CULTURE, GROUP A STREP (THRC)

## 2021-07-31 ENCOUNTER — Other Ambulatory Visit: Payer: Self-pay | Admitting: Nurse Practitioner

## 2021-08-09 DIAGNOSIS — L989 Disorder of the skin and subcutaneous tissue, unspecified: Secondary | ICD-10-CM | POA: Diagnosis not present

## 2021-08-09 DIAGNOSIS — B351 Tinea unguium: Secondary | ICD-10-CM | POA: Diagnosis not present

## 2021-08-09 DIAGNOSIS — Z6831 Body mass index (BMI) 31.0-31.9, adult: Secondary | ICD-10-CM | POA: Diagnosis not present

## 2021-08-09 DIAGNOSIS — Z1331 Encounter for screening for depression: Secondary | ICD-10-CM | POA: Diagnosis not present

## 2021-08-09 DIAGNOSIS — E6609 Other obesity due to excess calories: Secondary | ICD-10-CM | POA: Diagnosis not present

## 2021-08-09 DIAGNOSIS — I10 Essential (primary) hypertension: Secondary | ICD-10-CM | POA: Diagnosis not present

## 2021-10-03 ENCOUNTER — Other Ambulatory Visit: Payer: Self-pay | Admitting: Nurse Practitioner

## 2021-10-03 DIAGNOSIS — I10 Essential (primary) hypertension: Secondary | ICD-10-CM

## 2021-10-24 DIAGNOSIS — E063 Autoimmune thyroiditis: Secondary | ICD-10-CM | POA: Diagnosis not present

## 2021-10-24 DIAGNOSIS — E038 Other specified hypothyroidism: Secondary | ICD-10-CM | POA: Diagnosis not present

## 2021-10-25 LAB — TSH: TSH: 0.917 u[IU]/mL (ref 0.450–4.500)

## 2021-10-25 LAB — T4, FREE: Free T4: 1.73 ng/dL (ref 0.82–1.77)

## 2021-10-31 ENCOUNTER — Ambulatory Visit (INDEPENDENT_AMBULATORY_CARE_PROVIDER_SITE_OTHER): Payer: BC Managed Care – PPO | Admitting: Nurse Practitioner

## 2021-10-31 ENCOUNTER — Encounter: Payer: Self-pay | Admitting: Nurse Practitioner

## 2021-10-31 VITALS — BP 132/88 | HR 68 | Ht 69.0 in | Wt 215.4 lb

## 2021-10-31 DIAGNOSIS — E063 Autoimmune thyroiditis: Secondary | ICD-10-CM | POA: Diagnosis not present

## 2021-10-31 DIAGNOSIS — E559 Vitamin D deficiency, unspecified: Secondary | ICD-10-CM | POA: Diagnosis not present

## 2021-10-31 DIAGNOSIS — E038 Other specified hypothyroidism: Secondary | ICD-10-CM

## 2021-10-31 DIAGNOSIS — E7800 Pure hypercholesterolemia, unspecified: Secondary | ICD-10-CM

## 2021-10-31 MED ORDER — LEVOTHYROXINE SODIUM 137 MCG PO TABS: ORAL_TABLET | ORAL | 3 refills | Status: DC

## 2021-10-31 NOTE — Patient Instructions (Signed)

## 2021-10-31 NOTE — Progress Notes (Signed)
10/31/2021, 11:27 AM           Endocrinology follow-up note   SUBJECTIVE:  April Pennington is a 61 y.o.-year-old female patient being engaged for follow-up for hypothyroidism related to Hashimoto's thyroiditis, vitamin D deficiency.   Past Medical History:  Diagnosis Date   Gallstones    GERD (gastroesophageal reflux disease)    uses Alka seltzer   Past Surgical History:  Procedure Laterality Date   BREAST SURGERY     cyst removed from right breast at age 60   CHOLECYSTECTOMY N/A 05/20/2013   Procedure: LAPAROSCOPIC CHOLECYSTECTOMY WITH INTRAOPERATIVE CHOLANGIOGRAM;  Surgeon: Axel Filler, MD;  Location: [facility] OR;  Service: General;  Laterality: N/A;   TUBAL LIGATION     Social History   Socioeconomic History   Marital status: Married    Spouse name: Not on file   Number of children: Not on file   Years of education: Not on file   Highest education level: Not on file  Occupational History   Not on file  Tobacco Use   Smoking status: Former    Packs/day: 0.00    Types: Cigarettes    Quit date: 02/11/2012    Years since quitting: 9.7   Smokeless tobacco: Never  Vaping Use   Vaping Use: Every day   Substances: Nicotine  Substance and Sexual Activity   Alcohol use: No   Drug use: No   Sexual activity: Not Currently  Other Topics Concern   Not on file  Social History Narrative   ** Merged History Encounter **       Social Determinants of Health   Financial Resource Strain: Not on file  Food Insecurity: Not on file  Transportation Needs: Not on file  Physical Activity: Not on file  Stress: Not on file  Social Connections: Not on file   Outpatient Encounter Medications as of 10/31/2021  Medication Sig   amoxicillin (AMOXIL) 875 MG tablet Take 1 tablet (875 mg total) by mouth 2 (two) times daily.   Cholecalciferol (VITAMIN D3) 125 MCG (5000 UT) CAPS Take 1 capsule (5,000 Units total) by  mouth daily. (Patient not taking: Reported on 05/03/2021)   levothyroxine (SYNTHROID) 137 MCG tablet TAKE 1 TABLET(137 MCG) BY MOUTH DAILY BEFORE BREAKFAST   lidocaine (XYLOCAINE) 2 % solution Use as directed 10 mLs in the mouth or throat every 3 (three) hours as needed for mouth pain.   lisinopril (ZESTRIL) 20 MG tablet TAKE 1 TABLET(20 MG) BY MOUTH DAILY   [DISCONTINUED] levothyroxine (SYNTHROID) 137 MCG tablet TAKE 1 TABLET(137 MCG) BY MOUTH DAILY BEFORE BREAKFAST   No facility-administered encounter medications on file as of 10/31/2021.   ALLERGIES: Allergies  Allergen Reactions   Hydrocodone Nausea And Vomiting   Hydrocodone    Oxycodone Nausea And Vomiting   Vicodin [Hydrocodone-Acetaminophen] Nausea And Vomiting   Vicodin [Hydrocodone-Acetaminophen]    VACCINATION STATUS: Immunization History  Administered Date(s) Administered   Tdap 09/21/2016     HPI  Thyroid Problem Presents for follow-up (diagnosed  with hypothyroidism at approximate age of 37 years  which was recently confirmed to be related to her Hashimoto's thyroiditis.  She has + FH of thyroid disorders in: Her mother who is taking thyroid hormone supplements. No FH of thyroid cancer. ) visit. Symptoms include fatigue and weight gain. Patient reports no anxiety, cold intolerance, constipation, depressed mood, diarrhea, hair loss, heat intolerance, leg swelling, palpitations, tremors or weight loss. The symptoms have been stable.   April Pennington is a patient with the above medical history.   She is currently on Synthroid 125 mcg p.o. daily before breakfast.    She denies feeling nodules in neck, hoarseness, dysphagia/odynophagia, SOB with lying down.    Review of systems  Constitutional: + Minimally fluctuating body weight,  current Body mass index is 31.81 kg/m. , + fatigue, no subjective hyperthermia, no subjective hypothermia Eyes: no blurry vision, no xerophthalmia ENT: no sore throat, no nodules palpated in  throat, no dysphagia/odynophagia, no hoarseness Cardiovascular: no chest pain, no shortness of breath, no palpitations, no leg swelling Respiratory: no cough, no shortness of breath Gastrointestinal: no nausea/vomiting/diarrhea Musculoskeletal: no muscle/joint aches Skin: no rashes, no hyperemia, + hair loss- much improved Neurological: no tremors, no numbness, no tingling, no dizziness Psychiatric: + depression and anxiety, reports multiple stressors  ------------------------------------------------------------------------------------------------------------------------------  OBJECTIVE:  BP 132/88   Pulse 68   Ht 5\' 9"  (1.753 m)   Wt 215 lb 6.4 oz (97.7 kg)   BMI 31.81 kg/m  Wt Readings from Last 3 Encounters:  10/31/21 215 lb 6.4 oz (97.7 kg)  05/03/21 216 lb (98 kg)  10/13/20 206 lb (93.4 kg)   BP Readings from Last 3 Encounters:  10/31/21 132/88  06/07/21 (!) 146/96  05/03/21 132/71    Physical Exam- Limited  Constitutional:  Current Body mass index is 31.81 kg/m., not in acute distress, normal state of mind Eyes:  EOMI, no exophthalmos Neck: Supple Thyroid: No gross goiter, (has asymmetric thyroid with L < R) Cardiovascular: RRR, no murmers, rubs, or gallops, no edema Respiratory: Adequate breathing efforts, no crackles, rales, rhonchi, or wheezing Musculoskeletal: no gross deformities, strength intact in all four extremities, no gross restriction of joint movements Skin:  no rashes, no hyperemia Neurological: no tremor with outstretched hands    CMP     Component Value Date/Time   NA 142 04/26/2021 1133   K 4.3 04/26/2021 1133   CL 105 04/26/2021 1133   CO2 21 04/26/2021 1133   GLUCOSE 83 04/26/2021 1133   GLUCOSE 84 03/19/2019 0927   BUN 12 04/26/2021 1133   CREATININE 0.82 04/26/2021 1133   CREATININE 0.92 03/19/2019 0927   CALCIUM 9.2 04/26/2021 1133   PROT 7.0 04/26/2021 1133   ALBUMIN 4.6 04/26/2021 1133   AST 29 04/26/2021 1133   ALT 43 (H)  04/26/2021 1133   ALKPHOS 83 04/26/2021 1133   BILITOT 0.4 04/26/2021 1133   GFRNONAA 70 03/19/2019 0927   GFRAA 81 03/19/2019 0927    Lab Results  Component Value Date   TSH 0.917 10/24/2021   TSH 2.160 04/26/2021   TSH 0.400 (L) 10/12/2020   TSH 2.48 04/08/2020   TSH 2.13 10/06/2019   TSH 0.95 03/19/2019   TSH 8.05 (H) 11/15/2018   TSH 8.15 (H) 05/15/2018   TSH 9.43 (H) 11/02/2016   FREET4 1.73 10/24/2021   FREET4 1.20 04/26/2021   FREET4 1.32 10/12/2020   FREET4 1.2 04/08/2020   FREET4 1.2 10/06/2019   FREET4 1.3 03/19/2019   FREET4 1.1 11/15/2018   FREET4 1.1 05/15/2018  Right lobe of her thyroid is 4.6 cm, left lobe is 1.5 cm.  No discrete nodules on bilateral thyroid lobes. ----------------------------------------------------------------------------------------------------------------------------------  ASSESSMENT / PLAN: 1. Hypothyroidism due to Hashimoto's thyroiditis -Her previsit labs are consistent with appropriate hormone replacement.  She is advised to increase her Levothyroxine to 137 mcg po daily before breakfast.  We did go over symptoms of over-replacement and she is advised to reach out if she experiences those.   - We discussed about the correct intake of her thyroid hormone, on empty stomach at fasting, with water, separated by at least 30 minutes from breakfast and other medications,  and separated by more than 4 hours from calcium, iron, multivitamins, acid reflux medications (PPIs). -Patient is made aware of the fact that thyroid hormone replacement is needed for life, dose to be adjusted by periodic monitoring of thyroid function tests.  2.  Asymmetric thyroid -Due to palpable thyromegaly, she had baseline thyroid ultrasound on 04/01/2018.  Her thyroid ultrasound is remarkable for asymmetric thyroid with no discrete nodular lesions. No need for dedicated follow up at this time.  3. Hypertension Her blood pressure is controlled to target.  She is  advised to continue Lisinopril 20 mg po daily with breakfast.  4. Hyperlipidemia Her recent lipid panel from 04/26/21 shows uncontrolled LDL of 154.  She is not currently on any lipid lowering medications.  She does not eat dairy or eggs in excess but does eat quite a bit of red meat.  We discussed recommended dietary changes and increasing exercise to avoid needing medications in the future.  Will recheck lipid panel prior to next visit.  5. Vitamin D deficiency: There is no recent vitamin D level to review.  She has not been taking her OTC supplement routinely.  Will recheck Vitamin D level prior to next visit.     I spent 22 minutes in the care of the patient today including review of labs from Thyroid Function, CMP, and other relevant labs ; imaging/biopsy records (current and previous including abstractions from other facilities); face-to-face time discussing  her lab results and symptoms, medications doses, her options of short and long term treatment based on the latest standards of care / guidelines;   and documenting the encounter.  Alfredia ClientJill D Gramlich  participated in the discussions, expressed understanding, and voiced agreement with the above plans.  All questions were answered to her satisfaction. she is encouraged to contact clinic should she have any questions or concerns prior to her return visit.   Follow Up Plan: Return in about 3 months (around 01/31/2022) for Thyroid follow up, Previsit labs.     Ronny BaconWhitney Loreena Valeri, [facility]NP-BC [facility] [address] [place], [place] [address] Phone: [phone] Fax: [phone]   10/31/2021, 11:27 AM

## 2022-01-03 ENCOUNTER — Telehealth: Payer: Self-pay | Admitting: Nurse Practitioner

## 2022-01-03 DIAGNOSIS — I10 Essential (primary) hypertension: Secondary | ICD-10-CM

## 2022-01-03 NOTE — Telephone Encounter (Signed)
Pt said she has switched to [facility] on [address] and needs a new RX of her Lisinopril

## 2022-01-04 MED ORDER — LISINOPRIL 20 MG PO TABS: ORAL_TABLET | ORAL | 1 refills | Status: DC

## 2022-01-04 NOTE — Telephone Encounter (Signed)
Rx sent 

## 2022-01-31 DIAGNOSIS — E038 Other specified hypothyroidism: Secondary | ICD-10-CM | POA: Diagnosis not present

## 2022-01-31 DIAGNOSIS — E7800 Pure hypercholesterolemia, unspecified: Secondary | ICD-10-CM | POA: Diagnosis not present

## 2022-01-31 DIAGNOSIS — E559 Vitamin D deficiency, unspecified: Secondary | ICD-10-CM | POA: Diagnosis not present

## 2022-01-31 DIAGNOSIS — E063 Autoimmune thyroiditis: Secondary | ICD-10-CM | POA: Diagnosis not present

## 2022-02-01 LAB — LIPID PANEL
Chol/HDL Ratio: 4.3 ratio (ref 0.0–4.4)
Cholesterol, Total: 204 mg/dL — ABNORMAL HIGH (ref 100–199)
HDL: 47 mg/dL (ref >39)
LDL Chol Calc (NIH): 135 mg/dL — ABNORMAL HIGH (ref 0–99)
Triglycerides: 120 mg/dL (ref 0–149)
VLDL Cholesterol Cal: 22 mg/dL (ref 5–40)

## 2022-02-01 LAB — VITAMIN D 25 HYDROXY (VIT D DEFICIENCY, FRACTURES): Vit D, 25-Hydroxy: 54.4 ng/mL (ref 30.0–100.0)

## 2022-02-01 LAB — TSH: TSH: 1.39 u[IU]/mL (ref 0.450–4.500)

## 2022-02-01 LAB — T4, FREE: Free T4: 1.43 ng/dL (ref 0.82–1.77)

## 2022-02-01 NOTE — Patient Instructions (Signed)

## 2022-02-02 ENCOUNTER — Encounter: Payer: Self-pay | Admitting: Nurse Practitioner

## 2022-02-02 ENCOUNTER — Ambulatory Visit (INDEPENDENT_AMBULATORY_CARE_PROVIDER_SITE_OTHER): Payer: BC Managed Care – PPO | Admitting: Nurse Practitioner

## 2022-02-02 VITALS — BP 134/81 | HR 67 | Ht 69.0 in | Wt 213.0 lb

## 2022-02-02 DIAGNOSIS — E559 Vitamin D deficiency, unspecified: Secondary | ICD-10-CM

## 2022-02-02 DIAGNOSIS — E7800 Pure hypercholesterolemia, unspecified: Secondary | ICD-10-CM

## 2022-02-02 DIAGNOSIS — I10 Essential (primary) hypertension: Secondary | ICD-10-CM | POA: Diagnosis not present

## 2022-02-02 DIAGNOSIS — E038 Other specified hypothyroidism: Secondary | ICD-10-CM | POA: Diagnosis not present

## 2022-02-02 DIAGNOSIS — E063 Autoimmune thyroiditis: Secondary | ICD-10-CM

## 2022-02-02 NOTE — Progress Notes (Signed)
02/02/2022, 10:42 AM           Endocrinology follow-up note   SUBJECTIVE:  April Pennington is a 61 y.o.-year-old female patient being engaged for follow-up for hypothyroidism related to Hashimoto's thyroiditis, vitamin D deficiency.   Past Medical History:  Diagnosis Date   Gallstones    GERD (gastroesophageal reflux disease)    uses Alka seltzer   Past Surgical History:  Procedure Laterality Date   BREAST SURGERY     cyst removed from right breast at age 61   CHOLECYSTECTOMY N/A 05/20/2013   Procedure: LAPAROSCOPIC CHOLECYSTECTOMY WITH INTRAOPERATIVE CHOLANGIOGRAM;  Surgeon: Axel Filler, MD;  Location: [facility] OR;  Service: General;  Laterality: N/A;   TUBAL LIGATION     Social History   Socioeconomic History   Marital status: Married    Spouse name: Not on file   Number of children: Not on file   Years of education: Not on file   Highest education level: Not on file  Occupational History   Not on file  Tobacco Use   Smoking status: Former    Packs/day: 0.00    Types: Cigarettes    Quit date: 02/11/2012    Years since quitting: 9.9   Smokeless tobacco: Never  Vaping Use   Vaping Use: Every day   Substances: Nicotine  Substance and Sexual Activity   Alcohol use: No   Drug use: No   Sexual activity: Not Currently  Other Topics Concern   Not on file  Social History Narrative   ** Merged History Encounter **       Social Determinants of Health   Financial Resource Strain: Not on file  Food Insecurity: Not on file  Transportation Needs: Not on file  Physical Activity: Not on file  Stress: Not on file  Social Connections: Not on file   Outpatient Encounter Medications as of 02/02/2022  Medication Sig   levothyroxine (SYNTHROID) 137 MCG tablet TAKE 1 TABLET(137 MCG) BY MOUTH DAILY BEFORE BREAKFAST   lisinopril (ZESTRIL) 20 MG tablet TAKE 1 TABLET(20 MG) BY MOUTH DAILY   terbinafine  (LAMISIL) 250 MG tablet Take 250 mg by mouth daily.   [DISCONTINUED] amoxicillin (AMOXIL) 875 MG tablet Take 1 tablet (875 mg total) by mouth 2 (two) times daily.   [DISCONTINUED] Cholecalciferol (VITAMIN D3) 125 MCG (5000 UT) CAPS Take 1 capsule (5,000 Units total) by mouth daily. (Patient not taking: Reported on 05/03/2021)   [DISCONTINUED] lidocaine (XYLOCAINE) 2 % solution Use as directed 10 mLs in the mouth or throat every 3 (three) hours as needed for mouth pain.   No facility-administered encounter medications on file as of 02/02/2022.   ALLERGIES: Allergies  Allergen Reactions   Hydrocodone Nausea And Vomiting   Hydrocodone    Oxycodone Nausea And Vomiting   Vicodin [Hydrocodone-Acetaminophen] Nausea And Vomiting   Vicodin [Hydrocodone-Acetaminophen]    VACCINATION STATUS: Immunization History  Administered Date(s) Administered   Tdap 09/21/2016     HPI  Thyroid Problem Presents for follow-up (diagnosed  with hypothyroidism at approximate age of 52 years which  was recently confirmed to be related to her Hashimoto's thyroiditis.  She has + FH of thyroid disorders in: Her mother who is taking thyroid hormone supplements. No FH of thyroid cancer. ) visit. Symptoms include fatigue and weight gain. Patient reports no anxiety, cold intolerance, constipation, depressed mood, diarrhea, hair loss, heat intolerance, leg swelling, palpitations, tremors or weight loss. The symptoms have been stable.    April Pennington is a patient with the above medical history.   She is currently on Synthroid 137 mcg p.o. daily before breakfast.    She denies feeling nodules in neck, hoarseness, dysphagia/odynophagia, SOB with lying down.    Review of systems  Constitutional: + Minimally fluctuating body weight,  current Body mass index is 31.45 kg/m. , + fatigue-continued, no subjective hyperthermia, no subjective hypothermia Eyes: no blurry vision, no xerophthalmia ENT: no sore throat, no nodules  palpated in throat, no dysphagia/odynophagia, no hoarseness Cardiovascular: no chest pain, no shortness of breath, no palpitations, no leg swelling Respiratory: no cough, no shortness of breath Gastrointestinal: no nausea/vomiting/diarrhea Musculoskeletal: no muscle/joint aches Skin: no rashes, no hyperemia, + hair loss- much improved Neurological: no tremors, no numbness, no tingling, no dizziness Psychiatric: + depression and anxiety, reports multiple stressors  ------------------------------------------------------------------------------------------------------------------------------  OBJECTIVE:  BP 134/81   Pulse 67   Ht 5\' 9"  (1.753 m)   Wt 213 lb (96.6 kg)   BMI 31.45 kg/m  Wt Readings from Last 3 Encounters:  02/02/22 213 lb (96.6 kg)  10/31/21 215 lb 6.4 oz (97.7 kg)  05/03/21 216 lb (98 kg)   BP Readings from Last 3 Encounters:  02/02/22 134/81  10/31/21 132/88  06/07/21 (!) 146/96    Physical Exam- Limited  Constitutional:  Current Body mass index is 31.45 kg/m., not in acute distress, normal state of mind Eyes:  EOMI, no exophthalmos Neck: Supple Thyroid: No gross goiter, (has asymmetric thyroid with L < R) Cardiovascular: RRR, no murmurs, rubs, or gallops, no edema Respiratory: Adequate breathing efforts, no crackles, rales, rhonchi, or wheezing Musculoskeletal: no gross deformities, strength intact in all four extremities, no gross restriction of joint movements Skin:  no rashes, no hyperemia Neurological: no tremor with outstretched hands    CMP     Component Value Date/Time   NA 142 04/26/2021 1133   K 4.3 04/26/2021 1133   CL 105 04/26/2021 1133   CO2 21 04/26/2021 1133   GLUCOSE 83 04/26/2021 1133   GLUCOSE 84 03/19/2019 0927   BUN 12 04/26/2021 1133   CREATININE 0.82 04/26/2021 1133   CREATININE 0.92 03/19/2019 0927   CALCIUM 9.2 04/26/2021 1133   PROT 7.0 04/26/2021 1133   ALBUMIN 4.6 04/26/2021 1133   AST 29 04/26/2021 1133   ALT 43  (H) 04/26/2021 1133   ALKPHOS 83 04/26/2021 1133   BILITOT 0.4 04/26/2021 1133   GFRNONAA 70 03/19/2019 0927   GFRAA 81 03/19/2019 0927    Lab Results  Component Value Date   TSH 1.390 01/31/2022   TSH 0.917 10/24/2021   TSH 2.160 04/26/2021   TSH 0.400 (L) 10/12/2020   TSH 2.48 04/08/2020   TSH 2.13 10/06/2019   TSH 0.95 03/19/2019   TSH 8.05 (H) 11/15/2018   TSH 8.15 (H) 05/15/2018   TSH 9.43 (H) 11/02/2016   FREET4 1.43 01/31/2022   FREET4 1.73 10/24/2021   FREET4 1.20 04/26/2021   FREET4 1.32 10/12/2020   FREET4 1.2 04/08/2020   FREET4 1.2 10/06/2019   FREET4 1.3 03/19/2019   FREET4 1.1 11/15/2018  FREET4 1.1 05/15/2018      Right lobe of her thyroid is 4.6 cm, left lobe is 1.5 cm.  No discrete nodules on bilateral thyroid lobes.    Latest Reference Range & Units 04/08/20 10:03 10/12/20 09:35 04/26/21 11:33 10/24/21 10:56 01/31/22 08:32  TSH 0.450 - 4.500 uIU/mL 2.48 0.400 (L) 2.160 0.917 1.390  T4,Free(Direct) 0.82 - 1.77 ng/dL 1.2 7.61 9.50 9.32 6.71  (L): Data is abnormally low  ----------------------------------------------------------------------------------------------------------------------------------  ASSESSMENT / PLAN: 1. Hypothyroidism due to Hashimoto's thyroiditis -Her previsit labs are consistent with appropriate hormone replacement.  She is advised to increase her Levothyroxine to 137 mcg po daily before breakfast.  We did go over symptoms of over-replacement and she is advised to reach out if she experiences those.   - We discussed about the correct intake of her thyroid hormone, on empty stomach at fasting, with water, separated by at least 30 minutes from breakfast and other medications,  and separated by more than 4 hours from calcium, iron, multivitamins, acid reflux medications (PPIs). -Patient is made aware of the fact that thyroid hormone replacement is needed for life, dose to be adjusted by periodic monitoring of thyroid function  tests.  2.  Asymmetric thyroid -Due to palpable thyromegaly, she had baseline thyroid ultrasound on 04/01/2018.  Her thyroid ultrasound is remarkable for asymmetric thyroid with no discrete nodular lesions. No need for dedicated follow up at this time.  3. Hypertension Her blood pressure is controlled to target.  She is advised to continue Lisinopril 20 mg po daily with breakfast.  4. Hyperlipidemia Her recent lipid panel from 01/31/22 shows uncontrolled LDL of 135 (improving).  She is not currently on any lipid lowering medications.  She does not eat dairy or eggs in excess but does eat quite a bit of red meat.  We discussed recommended dietary changes and increasing exercise to avoid needing medications in the future.    5. Vitamin D deficiency: Her recent vitamin D level was 54.4 on 01/31/22, improving.  She does take OTC Vitamin D 3 5000 units daily- does skip some days.      I spent 26 minutes in the care of the patient today including review of labs from Thyroid Function, CMP, and other relevant labs ; imaging/biopsy records (current and previous including abstractions from other facilities); face-to-face time discussing  her lab results and symptoms, medications doses, her options of short and long term treatment based on the latest standards of care / guidelines;   and documenting the encounter.  Alfredia Client  participated in the discussions, expressed understanding, and voiced agreement with the above plans.  All questions were answered to her satisfaction. she is encouraged to contact clinic should she have any questions or concerns prior to her return visit.   Follow Up Plan: Return in about 4 months (around 06/04/2022) for Thyroid follow up, Previsit labs.     Ronny Bacon, [facility] [address] [place], [place] [address] Phone: [phone] Fax: [phone]   02/02/2022, 10:42 AM

## 2022-02-15 DIAGNOSIS — Z0000 Encounter for general adult medical examination without abnormal findings: Secondary | ICD-10-CM | POA: Diagnosis not present

## 2022-02-15 DIAGNOSIS — Z0001 Encounter for general adult medical examination with abnormal findings: Secondary | ICD-10-CM | POA: Diagnosis not present

## 2022-02-15 DIAGNOSIS — E038 Other specified hypothyroidism: Secondary | ICD-10-CM | POA: Diagnosis not present

## 2022-06-07 ENCOUNTER — Ambulatory Visit: Payer: BC Managed Care – PPO | Admitting: Nurse Practitioner

## 2022-06-16 DIAGNOSIS — E038 Other specified hypothyroidism: Secondary | ICD-10-CM | POA: Diagnosis not present

## 2022-06-16 DIAGNOSIS — E063 Autoimmune thyroiditis: Secondary | ICD-10-CM | POA: Diagnosis not present

## 2022-06-17 LAB — T4, FREE: Free T4: 1.64 ng/dL (ref 0.82–1.77)

## 2022-06-17 LAB — TSH: TSH: 5.26 u[IU]/mL — ABNORMAL HIGH (ref 0.450–4.500)

## 2022-06-21 NOTE — Patient Instructions (Signed)

## 2022-06-22 ENCOUNTER — Ambulatory Visit (INDEPENDENT_AMBULATORY_CARE_PROVIDER_SITE_OTHER): Payer: BC Managed Care – PPO | Admitting: Nurse Practitioner

## 2022-06-22 ENCOUNTER — Encounter: Payer: Self-pay | Admitting: Nurse Practitioner

## 2022-06-22 VITALS — BP 137/92 | HR 83 | Ht 69.0 in | Wt 219.0 lb

## 2022-06-22 DIAGNOSIS — E7800 Pure hypercholesterolemia, unspecified: Secondary | ICD-10-CM

## 2022-06-22 DIAGNOSIS — E063 Autoimmune thyroiditis: Secondary | ICD-10-CM

## 2022-06-22 DIAGNOSIS — E559 Vitamin D deficiency, unspecified: Secondary | ICD-10-CM

## 2022-06-22 DIAGNOSIS — I10 Essential (primary) hypertension: Secondary | ICD-10-CM | POA: Diagnosis not present

## 2022-06-22 DIAGNOSIS — E038 Other specified hypothyroidism: Secondary | ICD-10-CM | POA: Diagnosis not present

## 2022-06-22 DIAGNOSIS — L659 Nonscarring hair loss, unspecified: Secondary | ICD-10-CM

## 2022-06-22 MED ORDER — LISINOPRIL 20 MG PO TABS: ORAL_TABLET | ORAL | 3 refills | Status: DC

## 2022-06-22 MED ORDER — LEVOTHYROXINE SODIUM 137 MCG PO TABS: ORAL_TABLET | ORAL | 3 refills | Status: DC

## 2022-06-22 NOTE — Progress Notes (Signed)
06/22/2022, 4:02 PM           Endocrinology follow-up note   SUBJECTIVE:  April Pennington is a 62 y.o.-year-old female patient being engaged for follow-up for hypothyroidism related to Hashimoto's thyroiditis, vitamin D deficiency.   Past Medical History:  Diagnosis Date   Gallstones    GERD (gastroesophageal reflux disease)    uses Alka seltzer   Past Surgical History:  Procedure Laterality Date   BREAST SURGERY     cyst removed from right breast at age 26   CHOLECYSTECTOMY N/A 05/20/2013   Procedure: LAPAROSCOPIC CHOLECYSTECTOMY WITH INTRAOPERATIVE CHOLANGIOGRAM;  Surgeon: Ralene Ok, MD;  Location: [facility];  Service: General;  Laterality: N/A;   TUBAL LIGATION     Social History   Socioeconomic History   Marital status: Married    Spouse name: Not on file   Number of children: Not on file   Years of education: Not on file   Highest education level: Not on file  Occupational History   Not on file  Tobacco Use   Smoking status: Former    Packs/day: 0.00    Types: Cigarettes    Quit date: 02/11/2012    Years since quitting: 10.3   Smokeless tobacco: Never  Vaping Use   Vaping Use: Every day   Substances: Nicotine  Substance and Sexual Activity   Alcohol use: No   Drug use: No   Sexual activity: Not Currently  Other Topics Concern   Not on file  Social History Narrative   ** Merged History Encounter **       Social Determinants of Health   Financial Resource Strain: Not on file  Food Insecurity: Not on file  Transportation Needs: Not on file  Physical Activity: Not on file  Stress: Not on file  Social Connections: Not on file   Outpatient Encounter Medications as of 06/22/2022  Medication Sig   terbinafine (LAMISIL) 250 MG tablet Take 250 mg by mouth daily.   [DISCONTINUED] levothyroxine (SYNTHROID) 137 MCG tablet TAKE 1 TABLET(137 MCG) BY MOUTH DAILY BEFORE BREAKFAST    [DISCONTINUED] lisinopril (ZESTRIL) 20 MG tablet TAKE 1 TABLET(20 MG) BY MOUTH DAILY   levothyroxine (SYNTHROID) 137 MCG tablet TAKE 1 TABLET(137 MCG) BY MOUTH DAILY BEFORE BREAKFAST   lisinopril (ZESTRIL) 20 MG tablet TAKE 1 TABLET(20 MG) BY MOUTH DAILY   No facility-administered encounter medications on file as of 06/22/2022.   ALLERGIES: Allergies  Allergen Reactions   Hydrocodone Nausea And Vomiting   Hydrocodone    Oxycodone Nausea And Vomiting   Vicodin [Hydrocodone-Acetaminophen] Nausea And Vomiting   Vicodin [Hydrocodone-Acetaminophen]    VACCINATION STATUS: Immunization History  Administered Date(s) Administered   Tdap 09/21/2016     HPI  Thyroid Problem Presents for follow-up (diagnosed  with hypothyroidism at approximate age of 19 years which was recently confirmed to be related to her Hashimoto's thyroiditis.  She has + FH of thyroid disorders in: Her mother who is taking thyroid hormone supplements. No FH of thyroid cancer. ) visit. Symptoms include fatigue and weight gain. Patient  reports no anxiety, cold intolerance, constipation, depressed mood, diarrhea, hair loss, heat intolerance, leg swelling, palpitations, tremors or weight loss. The symptoms have been stable.    April Pennington is a patient with the above medical history.   She is currently on Synthroid 137 mcg p.o. daily before breakfast.    She denies feeling nodules in neck, hoarseness, dysphagia/odynophagia, SOB with lying down.    Review of systems  Constitutional: + Minimally fluctuating body weight,  current Body mass index is 32.34 kg/m. , + fatigue-continued, no subjective hyperthermia, no subjective hypothermia Eyes: no blurry vision, no xerophthalmia ENT: no sore throat, no nodules palpated in throat, no dysphagia/odynophagia, no hoarseness Cardiovascular: no chest pain, no shortness of breath, no palpitations, no leg swelling Respiratory: no cough, no shortness of breath Gastrointestinal: no  nausea/vomiting/diarrhea Musculoskeletal: no muscle/joint aches Skin: no rashes, no hyperemia, + hair loss- much improved Neurological: no tremors, no numbness, no tingling, no dizziness Psychiatric: + depression and anxiety, reports multiple stressors  ------------------------------------------------------------------------------------------------------------------------------  OBJECTIVE:  BP (!) 137/92 (BP Location: Left Arm, Patient Position: Sitting, Cuff Size: Large)   Pulse 83   Ht 5\' 9"  (1.753 m)   Wt 219 lb (99.3 kg)   BMI 32.34 kg/m  Wt Readings from Last 3 Encounters:  06/22/22 219 lb (99.3 kg)  02/02/22 213 lb (96.6 kg)  10/31/21 215 lb 6.4 oz (97.7 kg)   BP Readings from Last 3 Encounters:  06/22/22 (!) 137/92  02/02/22 134/81  10/31/21 132/88    Physical Exam- Limited  Constitutional:  Current Body mass index is 32.34 kg/m., not in acute distress, normal state of mind Eyes:  EOMI, no exophthalmos Musculoskeletal: no gross deformities, strength intact in all four extremities, no gross restriction of joint movements Skin:  no rashes, no hyperemia Neurological: no tremor with outstretched hands    CMP     Component Value Date/Time   NA 142 04/26/2021 1133   K 4.3 04/26/2021 1133   CL 105 04/26/2021 1133   CO2 21 04/26/2021 1133   GLUCOSE 83 04/26/2021 1133   GLUCOSE 84 03/19/2019 0927   BUN 12 04/26/2021 1133   CREATININE 0.82 04/26/2021 1133   CREATININE 0.92 03/19/2019 0927   CALCIUM 9.2 04/26/2021 1133   PROT 7.0 04/26/2021 1133   ALBUMIN 4.6 04/26/2021 1133   AST 29 04/26/2021 1133   ALT 43 (H) 04/26/2021 1133   ALKPHOS 83 04/26/2021 1133   BILITOT 0.4 04/26/2021 1133   GFRNONAA 70 03/19/2019 0927   GFRAA 81 03/19/2019 0927    Lab Results  Component Value Date   TSH 5.260 (H) 06/16/2022   TSH 1.390 01/31/2022   TSH 0.917 10/24/2021   TSH 2.160 04/26/2021   TSH 0.400 (L) 10/12/2020   TSH 2.48 04/08/2020   TSH 2.13 10/06/2019   TSH  0.95 03/19/2019   TSH 8.05 (H) 11/15/2018   TSH 8.15 (H) 05/15/2018   FREET4 1.64 06/16/2022   FREET4 1.43 01/31/2022   FREET4 1.73 10/24/2021   FREET4 1.20 04/26/2021   FREET4 1.32 10/12/2020   FREET4 1.2 04/08/2020   FREET4 1.2 10/06/2019   FREET4 1.3 03/19/2019   FREET4 1.1 11/15/2018   FREET4 1.1 05/15/2018      Right lobe of her thyroid is 4.6 cm, left lobe is 1.5 cm.  No discrete nodules on bilateral thyroid lobes.    Latest Reference Range & Units 04/26/21 11:33 10/24/21 10:56 01/31/22 08:32 06/16/22 10:49  TSH 0.450 - 4.500 uIU/mL 2.160 0.917 1.390 5.260 (  H)  T4,Free(Direct) 0.82 - 1.77 ng/dL 1.20 1.73 1.43 1.64  (H): Data is abnormally high  ----------------------------------------------------------------------------------------------------------------------------------  ASSESSMENT / PLAN: 1. Hypothyroidism due to Hashimoto's thyroiditis -Her previsit labs are consistent with appropriate hormone replacement (TSH abnormally high but Free T4 normal).  She is advised to continue her Levothyroxine 137 mcg po daily before breakfast. .   - We discussed about the correct intake of her thyroid hormone, on empty stomach at fasting, with water, separated by at least 30 minutes from breakfast and other medications,  and separated by more than 4 hours from calcium, iron, multivitamins, acid reflux medications (PPIs). -Patient is made aware of the fact that thyroid hormone replacement is needed for life, dose to be adjusted by periodic monitoring of thyroid function tests.  2.  Asymmetric thyroid -Due to palpable thyromegaly, she had baseline thyroid ultrasound on 04/01/2018.  Her thyroid ultrasound is remarkable for asymmetric thyroid with no discrete nodular lesions. No need for dedicated follow up at this time.  3. Hypertension Her blood pressure is slightly above target today.  She is advised to continue Lisinopril 20 mg po daily with breakfast.  4. Hyperlipidemia Her recent  lipid panel from 01/31/22 shows uncontrolled LDL of 135 (improving).  She is not currently on any lipid lowering medications.  She does not eat dairy or eggs in excess but does eat quite a bit of red meat.  We discussed recommended dietary changes and increasing exercise to avoid needing medications in the future.    5. Vitamin D deficiency: Her recent vitamin D level was 54.4 on 01/31/22, improving.  She does take OTC Vitamin D 3 5000 units daily- does skip some days.     I spent 20 minutes in the care of the patient today including review of labs from Thyroid Function, CMP, and other relevant labs ; imaging/biopsy records (current and previous including abstractions from other facilities); face-to-face time discussing  her lab results and symptoms, medications doses, her options of short and long term treatment based on the latest standards of care / guidelines;   and documenting the encounter.  [name]  participated in the discussions, expressed understanding, and voiced agreement with the above plans.  All questions were answered to her satisfaction. she is encouraged to contact clinic should she have any questions or concerns prior to her return visit.   Follow Up Plan: Return in about 4 months (around 10/21/2022) for Thyroid follow up, Previsit labs.     Rayetta Pigg, [facility] [address] [place], [place] [address] Phone: [phone] Fax: [phone]   06/22/2022, 4:02 PM

## 2022-09-13 ENCOUNTER — Encounter: Payer: Self-pay | Admitting: Nurse Practitioner

## 2022-10-17 DIAGNOSIS — E063 Autoimmune thyroiditis: Secondary | ICD-10-CM | POA: Diagnosis not present

## 2022-10-17 DIAGNOSIS — E038 Other specified hypothyroidism: Secondary | ICD-10-CM | POA: Diagnosis not present

## 2022-10-18 LAB — TSH: TSH: 1.49 u[IU]/mL (ref 0.450–4.500)

## 2022-10-18 LAB — T4, FREE: Free T4: 1.7 ng/dL (ref 0.82–1.77)

## 2022-10-19 NOTE — Patient Instructions (Signed)

## 2022-10-23 ENCOUNTER — Ambulatory Visit (INDEPENDENT_AMBULATORY_CARE_PROVIDER_SITE_OTHER): Payer: BC Managed Care – PPO | Admitting: Nurse Practitioner

## 2022-10-23 ENCOUNTER — Encounter: Payer: Self-pay | Admitting: Nurse Practitioner

## 2022-10-23 VITALS — BP 114/74 | HR 60 | Ht 69.0 in | Wt 208.4 lb

## 2022-10-23 DIAGNOSIS — E559 Vitamin D deficiency, unspecified: Secondary | ICD-10-CM

## 2022-10-23 DIAGNOSIS — I10 Essential (primary) hypertension: Secondary | ICD-10-CM

## 2022-10-23 DIAGNOSIS — E7800 Pure hypercholesterolemia, unspecified: Secondary | ICD-10-CM | POA: Diagnosis not present

## 2022-10-23 DIAGNOSIS — E063 Autoimmune thyroiditis: Secondary | ICD-10-CM

## 2022-10-23 DIAGNOSIS — E038 Other specified hypothyroidism: Secondary | ICD-10-CM

## 2022-10-23 NOTE — Progress Notes (Signed)
10/23/2022, 9:27 AM           Endocrinology follow-up note   SUBJECTIVE:  April Pennington is a 62 y.o.-year-old female patient being engaged for follow-up for hypothyroidism related to Hashimoto's thyroiditis, vitamin D deficiency.   Past Medical History:  Diagnosis Date   Gallstones    GERD (gastroesophageal reflux disease)    uses Alka seltzer   Past Surgical History:  Procedure Laterality Date   BREAST SURGERY     cyst removed from right breast at age 11   CHOLECYSTECTOMY N/A 05/20/2013   Procedure: LAPAROSCOPIC CHOLECYSTECTOMY WITH INTRAOPERATIVE CHOLANGIOGRAM;  Surgeon: Axel Filler, MD;  Location: [facility];  Service: General;  Laterality: N/A;   TUBAL LIGATION     Social History   Socioeconomic History   Marital status: Married    Spouse name: Not on file   Number of children: Not on file   Years of education: Not on file   Highest education level: Not on file  Occupational History   Not on file  Tobacco Use   Smoking status: Former    Packs/day: 0    Types: Cigarettes    Quit date: 02/11/2012    Years since quitting: 10.7   Smokeless tobacco: Never  Vaping Use   Vaping Use: Every day   Substances: Nicotine  Substance and Sexual Activity   Alcohol use: No   Drug use: No   Sexual activity: Not Currently  Other Topics Concern   Not on file  Social History Narrative   ** Merged History Encounter **       Social Determinants of Health   Financial Resource Strain: Not on file  Food Insecurity: Not on file  Transportation Needs: Not on file  Physical Activity: Not on file  Stress: Not on file  Social Connections: Not on file   Outpatient Encounter Medications as of 10/23/2022  Medication Sig   levothyroxine (SYNTHROID) 137 MCG tablet TAKE 1 TABLET(137 MCG) BY MOUTH DAILY BEFORE BREAKFAST   lisinopril (ZESTRIL) 20 MG tablet TAKE 1 TABLET(20 MG) BY MOUTH DAILY   [DISCONTINUED]  terbinafine (LAMISIL) 250 MG tablet Take 250 mg by mouth daily. (Patient not taking: Reported on 10/23/2022)   No facility-administered encounter medications on file as of 10/23/2022.   ALLERGIES: Allergies  Allergen Reactions   Hydrocodone Nausea And Vomiting   Hydrocodone    Oxycodone Nausea And Vomiting   Vicodin [Hydrocodone-Acetaminophen] Nausea And Vomiting   Vicodin [Hydrocodone-Acetaminophen]    VACCINATION STATUS: Immunization History  Administered Date(s) Administered   Tdap 09/21/2016     HPI  Thyroid Problem Presents for follow-up (diagnosed  with hypothyroidism at approximate age of 52 years which was recently confirmed to be related to her Hashimoto's thyroiditis.  She has + FH of thyroid disorders in: Her mother who is taking thyroid hormone supplements. No FH of thyroid cancer. ) visit. Symptoms include fatigue and weight gain. Patient reports no anxiety, cold intolerance, constipation, depressed mood, diarrhea, hair loss, heat intolerance, leg swelling, palpitations, tremors or weight loss. The symptoms have been stable.  April Pennington is a patient with the above medical history.   She is currently on Levothyroxine 137 mcg p.o. daily before breakfast.    She denies feeling nodules in neck, hoarseness, dysphagia/odynophagia, SOB with lying down.   Review of systems  Constitutional: + steadily decreasing body weight,  current Body mass index is 30.78 kg/m. , no fatigue, no subjective hyperthermia, no subjective hypothermia Eyes: no blurry vision, no xerophthalmia ENT: no sore throat, no nodules palpated in throat, no dysphagia/odynophagia, no hoarseness Cardiovascular: no chest pain, no shortness of breath, no palpitations, no leg swelling Respiratory: no cough, no shortness of breath Gastrointestinal: no nausea/vomiting/diarrhea Musculoskeletal: no muscle/joint aches Skin: no rashes, no hyperemia Neurological: no tremors, no numbness, no tingling, no  dizziness Psychiatric: no depression, no anxiety  ------------------------------------------------------------------------------------------------------------------------------  OBJECTIVE:  BP 114/74 (BP Location: Right Arm, Patient Position: Sitting, Cuff Size: Large)   Pulse 60   Ht 5\' 9"  (1.753 m)   Wt 208 lb 6.4 oz (94.5 kg)   BMI 30.78 kg/m  Wt Readings from Last 3 Encounters:  10/23/22 208 lb 6.4 oz (94.5 kg)  06/22/22 219 lb (99.3 kg)  02/02/22 213 lb (96.6 kg)   BP Readings from Last 3 Encounters:  10/23/22 114/74  06/22/22 (!) 137/92  02/02/22 134/81     Physical Exam- Limited  Constitutional:  Body mass index is 30.78 kg/m. , not in acute distress, normal state of mind Eyes:  EOMI, no exophthalmos Musculoskeletal: no gross deformities, strength intact in all four extremities, no gross restriction of joint movements Skin:  no rashes, no hyperemia Neurological: no tremor with outstretched hands    CMP     Component Value Date/Time   NA 142 04/26/2021 1133   K 4.3 04/26/2021 1133   CL 105 04/26/2021 1133   CO2 21 04/26/2021 1133   GLUCOSE 83 04/26/2021 1133   GLUCOSE 84 03/19/2019 0927   BUN 12 04/26/2021 1133   CREATININE 0.82 04/26/2021 1133   CREATININE 0.92 03/19/2019 0927   CALCIUM 9.2 04/26/2021 1133   PROT 7.0 04/26/2021 1133   ALBUMIN 4.6 04/26/2021 1133   AST 29 04/26/2021 1133   ALT 43 (H) 04/26/2021 1133   ALKPHOS 83 04/26/2021 1133   BILITOT 0.4 04/26/2021 1133   GFRNONAA 70 03/19/2019 0927   GFRAA 81 03/19/2019 0927    Lab Results  Component Value Date   TSH 1.490 10/17/2022   TSH 5.260 (H) 06/16/2022   TSH 1.390 01/31/2022   TSH 0.917 10/24/2021   TSH 2.160 04/26/2021   TSH 0.400 (L) 10/12/2020   TSH 2.48 04/08/2020   TSH 2.13 10/06/2019   TSH 0.95 03/19/2019   TSH 8.05 (H) 11/15/2018   FREET4 1.70 10/17/2022   FREET4 1.64 06/16/2022   FREET4 1.43 01/31/2022   FREET4 1.73 10/24/2021   FREET4 1.20 04/26/2021   FREET4 1.32  10/12/2020   FREET4 1.2 04/08/2020   FREET4 1.2 10/06/2019   FREET4 1.3 03/19/2019   FREET4 1.1 11/15/2018      Right lobe of her thyroid is 4.6 cm, left lobe is 1.5 cm.  No discrete nodules on bilateral thyroid lobes.    Latest Reference Range & Units 10/24/21 10:56 01/31/22 08:32 06/16/22 10:49 10/17/22 08:48  TSH 0.450 - 4.500 uIU/mL 0.917 1.390 5.260 (H) 1.490  T4,Free(Direct) 0.82 - 1.77 ng/dL 4.09 8.11 9.14 7.82  (H): Data is abnormally high  ----------------------------------------------------------------------------------------------------------------------------------  ASSESSMENT / PLAN: 1. Hypothyroidism due to Hashimoto's thyroiditis -Her previsit labs are consistent with appropriate  hormone replacement.  She is advised to continue her Levothyroxine 137 mcg po daily before breakfast. .   - We discussed about the correct intake of her thyroid hormone, on empty stomach at fasting, with water, separated by at least 30 minutes from breakfast and other medications,  and separated by more than 4 hours from calcium, iron, multivitamins, acid reflux medications (PPIs). -Patient is made aware of the fact that thyroid hormone replacement is needed for life, dose to be adjusted by periodic monitoring of thyroid function tests.  2.  Asymmetric thyroid -Due to palpable thyromegaly, she had baseline thyroid ultrasound on 04/01/2018.  Her thyroid ultrasound is remarkable for asymmetric thyroid with no discrete nodular lesions. No need for dedicated follow up at this time.  3. Hypertension Her blood pressure is slightly above target today.  She is advised to continue Lisinopril 20 mg po daily with breakfast.  4. Hyperlipidemia Her recent lipid panel from 01/31/22 shows uncontrolled LDL of 135 (improving).  She is not currently on any lipid lowering medications.  She does not eat dairy or eggs in excess but does eat quite a bit of red meat.  We discussed recommended dietary changes and  increasing exercise to avoid needing medications in the future.  Will recheck lipid panel prior to next visit.  5. Vitamin D deficiency: Her recent vitamin D level was 54.4 on 01/31/22, improving.  She does take OTC Vitamin D 3 5000 units daily- does skip some days.  Will recheck vitamin D prior to next visit.    I spent  20  minutes in the care of the patient today including review of labs from Thyroid Function, CMP, and other relevant labs ; imaging/biopsy records (current and previous including abstractions from other facilities); face-to-face time discussing  her lab results and symptoms, medications doses, her options of short and long term treatment based on the latest standards of care / guidelines;   and documenting the encounter.  Alfredia Client  participated in the discussions, expressed understanding, and voiced agreement with the above plans.  All questions were answered to her satisfaction. she is encouraged to contact clinic should she have any questions or concerns prior to her return visit.   Follow Up Plan: Return in about 4 months (around 02/23/2023) for Thyroid follow up, Previsit labs.     Ronny Bacon, [facility] [address] [place], [place] [address] Phone: [phone] Fax: [phone]   10/23/2022, 9:27 AM

## 2023-02-23 ENCOUNTER — Ambulatory Visit: Payer: BC Managed Care – PPO | Admitting: Nurse Practitioner

## 2023-03-06 ENCOUNTER — Encounter: Payer: Self-pay | Admitting: Nurse Practitioner

## 2023-04-05 DIAGNOSIS — E063 Autoimmune thyroiditis: Secondary | ICD-10-CM | POA: Diagnosis not present

## 2023-04-05 DIAGNOSIS — E7800 Pure hypercholesterolemia, unspecified: Secondary | ICD-10-CM | POA: Diagnosis not present

## 2023-04-05 DIAGNOSIS — E559 Vitamin D deficiency, unspecified: Secondary | ICD-10-CM | POA: Diagnosis not present

## 2023-04-05 DIAGNOSIS — I10 Essential (primary) hypertension: Secondary | ICD-10-CM | POA: Diagnosis not present

## 2023-04-05 DIAGNOSIS — E038 Other specified hypothyroidism: Secondary | ICD-10-CM | POA: Diagnosis not present

## 2023-04-06 LAB — LIPID PANEL
Chol/HDL Ratio: 4.1 {ratio} (ref 0.0–4.4)
Cholesterol, Total: 189 mg/dL (ref 100–199)
HDL: 46 mg/dL (ref >39)
LDL Chol Calc (NIH): 121 mg/dL — ABNORMAL HIGH (ref 0–99)
Triglycerides: 125 mg/dL (ref 0–149)
VLDL Cholesterol Cal: 22 mg/dL (ref 5–40)

## 2023-04-06 LAB — COMPREHENSIVE METABOLIC PANEL
ALT: 21 [IU]/L (ref 0–32)
AST: 16 [IU]/L (ref 0–40)
Albumin: 4.3 g/dL (ref 3.8–4.9)
Alkaline Phosphatase: 84 [IU]/L (ref 44–121)
BUN/Creatinine Ratio: 11 — ABNORMAL LOW (ref 12–28)
BUN: 10 mg/dL (ref 8–27)
Bilirubin Total: 0.2 mg/dL (ref 0.0–1.2)
CO2: 21 mmol/L (ref 20–29)
Calcium: 9.3 mg/dL (ref 8.7–10.3)
Chloride: 105 mmol/L (ref 96–106)
Creatinine, Ser: 0.93 mg/dL (ref 0.57–1.00)
Globulin, Total: 2.6 g/dL (ref 1.5–4.5)
Glucose: 86 mg/dL (ref 70–99)
Potassium: 4.2 mmol/L (ref 3.5–5.2)
Sodium: 140 mmol/L (ref 134–144)
Total Protein: 6.9 g/dL (ref 6.0–8.5)
eGFR: 70 mL/min/{1.73_m2} (ref >59)

## 2023-04-06 LAB — TSH: TSH: 3.05 u[IU]/mL (ref 0.450–4.500)

## 2023-04-06 LAB — T4, FREE: Free T4: 1.7 ng/dL (ref 0.82–1.77)

## 2023-04-06 LAB — VITAMIN D 25 HYDROXY (VIT D DEFICIENCY, FRACTURES): Vit D, 25-Hydroxy: 52.6 ng/mL (ref 30.0–100.0)

## 2023-04-12 ENCOUNTER — Ambulatory Visit: Payer: BC Managed Care – PPO | Admitting: Nurse Practitioner

## 2023-04-12 DIAGNOSIS — E7800 Pure hypercholesterolemia, unspecified: Secondary | ICD-10-CM

## 2023-04-12 DIAGNOSIS — E559 Vitamin D deficiency, unspecified: Secondary | ICD-10-CM

## 2023-04-12 DIAGNOSIS — I10 Essential (primary) hypertension: Secondary | ICD-10-CM

## 2023-04-12 DIAGNOSIS — E063 Autoimmune thyroiditis: Secondary | ICD-10-CM

## 2023-04-18 ENCOUNTER — Ambulatory Visit: Payer: BC Managed Care – PPO | Admitting: Nurse Practitioner

## 2023-04-18 ENCOUNTER — Encounter: Payer: Self-pay | Admitting: Nurse Practitioner

## 2023-04-18 VITALS — BP 124/88 | HR 83 | Ht 69.0 in | Wt 196.0 lb

## 2023-04-18 DIAGNOSIS — E559 Vitamin D deficiency, unspecified: Secondary | ICD-10-CM | POA: Diagnosis not present

## 2023-04-18 DIAGNOSIS — I10 Essential (primary) hypertension: Secondary | ICD-10-CM | POA: Diagnosis not present

## 2023-04-18 DIAGNOSIS — E7800 Pure hypercholesterolemia, unspecified: Secondary | ICD-10-CM

## 2023-04-18 DIAGNOSIS — E063 Autoimmune thyroiditis: Secondary | ICD-10-CM

## 2023-04-18 MED ORDER — LEVOTHYROXINE SODIUM 137 MCG PO TABS: ORAL_TABLET | ORAL | 3 refills | Status: DC

## 2023-04-18 MED ORDER — LISINOPRIL 20 MG PO TABS: ORAL_TABLET | ORAL | 3 refills | Status: DC

## 2023-04-18 NOTE — Patient Instructions (Signed)

## 2023-04-18 NOTE — Progress Notes (Signed)
04/18/2023, 3:09 PM           Endocrinology follow-up note   SUBJECTIVE:  April Pennington is a 62 y.o.-year-old female patient being engaged for follow-up for hypothyroidism related to Hashimoto's thyroiditis, vitamin D deficiency.   Past Medical History:  Diagnosis Date   Gallstones    GERD (gastroesophageal reflux disease)    uses Alka seltzer   Past Surgical History:  Procedure Laterality Date   BREAST SURGERY     cyst removed from right breast at age 85   CHOLECYSTECTOMY N/A 05/20/2013   Procedure: LAPAROSCOPIC CHOLECYSTECTOMY WITH INTRAOPERATIVE CHOLANGIOGRAM;  Surgeon: Axel Filler, MD;  Location: [facility] OR;  Service: General;  Laterality: N/A;   TUBAL LIGATION     Social History   Socioeconomic History   Marital status: Married    Spouse name: Not on file   Number of children: Not on file   Years of education: Not on file   Highest education level: Not on file  Occupational History   Not on file  Tobacco Use   Smoking status: Former    Current packs/day: 0.00    Types: Cigarettes    Quit date: 02/11/2012    Years since quitting: 11.1   Smokeless tobacco: Never  Vaping Use   Vaping status: Every Day   Substances: Nicotine  Substance and Sexual Activity   Alcohol use: No   Drug use: No   Sexual activity: Not Currently  Other Topics Concern   Not on file  Social History Narrative   ** Merged History Encounter **       Social Determinants of Health   Financial Resource Strain: Not on file  Food Insecurity: Not on file  Transportation Needs: Not on file  Physical Activity: Not on file  Stress: Not on file  Social Connections: Not on file   Outpatient Encounter Medications as of 04/18/2023  Medication Sig   levothyroxine (SYNTHROID) 137 MCG tablet TAKE 1 TABLET(137 MCG) BY MOUTH DAILY BEFORE BREAKFAST   lisinopril (ZESTRIL) 20 MG tablet TAKE 1 TABLET(20 MG) BY MOUTH DAILY    [DISCONTINUED] levothyroxine (SYNTHROID) 137 MCG tablet TAKE 1 TABLET(137 MCG) BY MOUTH DAILY BEFORE BREAKFAST   [DISCONTINUED] lisinopril (ZESTRIL) 20 MG tablet TAKE 1 TABLET(20 MG) BY MOUTH DAILY   No facility-administered encounter medications on file as of 04/18/2023.   ALLERGIES: Allergies  Allergen Reactions   Hydrocodone Nausea And Vomiting   Hydrocodone    Oxycodone Nausea And Vomiting   Vicodin [Hydrocodone-Acetaminophen] Nausea And Vomiting   Vicodin [Hydrocodone-Acetaminophen]    VACCINATION STATUS: Immunization History  Administered Date(s) Administered   Tdap 09/21/2016     HPI  Thyroid Problem Presents for follow-up (diagnosed  with hypothyroidism at approximate age of 52 years which was recently confirmed to be related to her Hashimoto's thyroiditis.  She has + FH of thyroid disorders in: Her mother who is taking thyroid hormone supplements. No FH of thyroid cancer. ) visit. Symptoms include fatigue and weight gain. Patient reports no anxiety, cold intolerance, constipation, depressed mood, diarrhea, hair loss, heat  intolerance, leg swelling, palpitations, tremors or weight loss. The symptoms have been stable.    April Pennington is a patient with the above medical history.   She is currently on Levothyroxine 137 mcg p.o. daily before breakfast.    She denies feeling nodules in neck, hoarseness, dysphagia/odynophagia, SOB with lying down.   Review of systems  Constitutional: + steadily decreasing body weight,  current Body mass index is 28.94 kg/m. , no fatigue, no subjective hyperthermia, no subjective hypothermia Eyes: no blurry vision, no xerophthalmia ENT: no sore throat, no nodules palpated in throat, no dysphagia/odynophagia, no hoarseness Cardiovascular: no chest pain, no shortness of breath, no palpitations, no leg swelling Respiratory: no cough, no shortness of breath Gastrointestinal: no nausea/vomiting/diarrhea Musculoskeletal: no muscle/joint  aches Skin: no rashes, no hyperemia Neurological: no tremors, no numbness, no tingling, no dizziness Psychiatric: no depression, no anxiety  ------------------------------------------------------------------------------------------------------------------------------  OBJECTIVE:  BP 124/88   Pulse 83   Ht 5\' 9"  (1.753 m)   Wt 196 lb (88.9 kg)   BMI 28.94 kg/m  Wt Readings from Last 3 Encounters:  04/18/23 196 lb (88.9 kg)  10/23/22 208 lb 6.4 oz (94.5 kg)  06/22/22 219 lb (99.3 kg)   BP Readings from Last 3 Encounters:  04/18/23 124/88  10/23/22 114/74  06/22/22 (!) 137/92     Physical Exam- Limited  Constitutional:  Body mass index is 28.94 kg/m. , not in acute distress, normal state of mind Eyes:  EOMI, no exophthalmos Musculoskeletal: no gross deformities, strength intact in all four extremities, no gross restriction of joint movements Skin:  no rashes, no hyperemia Neurological: no tremor with outstretched hands    CMP     Component Value Date/Time   NA 140 04/05/2023 0937   K 4.2 04/05/2023 0937   CL 105 04/05/2023 0937   CO2 21 04/05/2023 0937   GLUCOSE 86 04/05/2023 0937   GLUCOSE 84 03/19/2019 0927   BUN 10 04/05/2023 0937   CREATININE 0.93 04/05/2023 0937   CREATININE 0.92 03/19/2019 0927   CALCIUM 9.3 04/05/2023 0937   PROT 6.9 04/05/2023 0937   ALBUMIN 4.3 04/05/2023 0937   AST 16 04/05/2023 0937   ALT 21 04/05/2023 0937   ALKPHOS 84 04/05/2023 0937   BILITOT 0.2 04/05/2023 0937   GFRNONAA 70 03/19/2019 0927   GFRAA 81 03/19/2019 0927    Lab Results  Component Value Date   TSH 3.050 04/05/2023   TSH 1.490 10/17/2022   TSH 5.260 (H) 06/16/2022   TSH 1.390 01/31/2022   TSH 0.917 10/24/2021   TSH 2.160 04/26/2021   TSH 0.400 (L) 10/12/2020   TSH 2.48 04/08/2020   TSH 2.13 10/06/2019   TSH 0.95 03/19/2019   FREET4 1.70 04/05/2023   FREET4 1.70 10/17/2022   FREET4 1.64 06/16/2022   FREET4 1.43 01/31/2022   FREET4 1.73 10/24/2021    FREET4 1.20 04/26/2021   FREET4 1.32 10/12/2020   FREET4 1.2 04/08/2020   FREET4 1.2 10/06/2019   FREET4 1.3 03/19/2019      Right lobe of her thyroid is 4.6 cm, left lobe is 1.5 cm.  No discrete nodules on bilateral thyroid lobes.    Latest Reference Range & Units 04/26/21 11:33 10/24/21 10:56 01/31/22 08:32 06/16/22 10:49 10/17/22 08:48 04/05/23 09:37  TSH 0.450 - 4.500 uIU/mL 2.160 0.917 1.390 5.260 (H) 1.490 3.050  T4,Free(Direct) 0.82 - 1.77 ng/dL 1.61 0.96 0.45 4.09 8.11 1.70  (H): Data is abnormally high  ----------------------------------------------------------------------------------------------------------------------------------  ASSESSMENT / PLAN: 1. Hypothyroidism due  to Hashimoto's thyroiditis -Her previsit labs are consistent with appropriate hormone replacement.  She is advised to continue her Levothyroxine 137 mcg po daily before breakfast. .   - We discussed about the correct intake of her thyroid hormone, on empty stomach at fasting, with water, separated by at least 30 minutes from breakfast and other medications,  and separated by more than 4 hours from calcium, iron, multivitamins, acid reflux medications (PPIs). -Patient is made aware of the fact that thyroid hormone replacement is needed for life, dose to be adjusted by periodic monitoring of thyroid function tests.  2.  Asymmetric thyroid -Due to palpable thyromegaly, she had baseline thyroid ultrasound on 04/01/2018.  Her thyroid ultrasound is remarkable for asymmetric thyroid with no discrete nodular lesions. No need for dedicated follow up at this time.  3. Hypertension Her blood pressure is at target today.  She is advised to continue Lisinopril 20 mg po daily with breakfast.  4. Hyperlipidemia Her recent lipid panel from 04/05/23 shows uncontrolled LDL of 121 (improving).  She is not currently on any lipid lowering medications.  She does not eat dairy or eggs in excess but does eat quite a bit of red  meat.  We discussed recommended dietary changes and increasing exercise to avoid needing medications in the future.    5. Vitamin D deficiency: Her recent vitamin D level was 52.6 on 04/05/23, stable.  She does take OTC Vitamin D 3 5000 units daily- does skip some days.      I spent  28  minutes in the care of the patient today including review of labs from Thyroid Function, CMP, and other relevant labs ; imaging/biopsy records (current and previous including abstractions from other facilities); face-to-face time discussing  her lab results and symptoms, medications doses, her options of short and long term treatment based on the latest standards of care / guidelines;   and documenting the encounter.  Alfredia Client  participated in the discussions, expressed understanding, and voiced agreement with the above plans.  All questions were answered to her satisfaction. she is encouraged to contact clinic should she have any questions or concerns prior to her return visit.   Follow Up Plan: Return in about 4 months (around 08/16/2023) for Thyroid follow up, Previsit labs.     Ronny Bacon, [facility] [address] [place], [place] [address] Phone: [phone] Fax: [phone]   04/18/2023, 3:09 PM

## 2023-08-16 ENCOUNTER — Ambulatory Visit: Payer: BC Managed Care – PPO | Admitting: Nurse Practitioner

## 2023-10-11 LAB — T4, FREE: Free T4: 1.29 ng/dL (ref 0.82–1.77)

## 2023-10-11 LAB — TSH: TSH: 8.66 u[IU]/mL — ABNORMAL HIGH (ref 0.450–4.500)

## 2023-10-22 ENCOUNTER — Telehealth: Payer: Self-pay | Admitting: Nurse Practitioner

## 2023-10-22 NOTE — Telephone Encounter (Signed)
 Pt states she was only off of her medicine for her hair that has biotin in it  for 1 week instead of 2.  She hasn't been taking them since.  Do you want to reput her labs in and let her redo them before her appt Thursday?  IF so can you put those order sin?

## 2023-10-22 NOTE — Telephone Encounter (Signed)
 No, its fine.  5 days is the required amount to be off Biotin so she should be ok.

## 2023-10-23 NOTE — Telephone Encounter (Signed)
 Sent pt mychart message explaining what Laurina Popper stated.

## 2023-10-24 ENCOUNTER — Ambulatory Visit: Admitting: Nurse Practitioner

## 2023-10-25 ENCOUNTER — Ambulatory Visit: Admitting: Nurse Practitioner

## 2023-10-25 ENCOUNTER — Encounter: Payer: Self-pay | Admitting: Nurse Practitioner

## 2023-10-25 VITALS — BP 128/88 | HR 78 | Ht 69.0 in | Wt 203.6 lb

## 2023-10-25 DIAGNOSIS — E559 Vitamin D deficiency, unspecified: Secondary | ICD-10-CM

## 2023-10-25 DIAGNOSIS — E7800 Pure hypercholesterolemia, unspecified: Secondary | ICD-10-CM

## 2023-10-25 DIAGNOSIS — E063 Autoimmune thyroiditis: Secondary | ICD-10-CM

## 2023-10-25 DIAGNOSIS — I10 Essential (primary) hypertension: Secondary | ICD-10-CM | POA: Diagnosis not present

## 2023-10-25 MED ORDER — LEVOTHYROXINE SODIUM 150 MCG PO TABS: 150.0000 ug | ORAL_TABLET | Freq: Every day | ORAL | 1 refills | Status: DC

## 2023-10-25 NOTE — Patient Instructions (Signed)

## 2023-10-25 NOTE — Progress Notes (Signed)
 10/25/2023, 10:27 AM           Endocrinology follow-up note   SUBJECTIVE:  April Pennington is a 63 y.o.-year-old female patient being engaged for follow-up for hypothyroidism related to Hashimoto's thyroiditis, vitamin D  deficiency.   Past Medical History:  Diagnosis Date   Gallstones    GERD (gastroesophageal reflux disease)    uses Alka seltzer   Past Surgical History:  Procedure Laterality Date   BREAST SURGERY     cyst removed from right breast at age 60   CHOLECYSTECTOMY N/A 05/20/2013   Procedure: LAPAROSCOPIC CHOLECYSTECTOMY WITH INTRAOPERATIVE CHOLANGIOGRAM;  Surgeon: Shela Derby, MD;  Location: [facility] OR;  Service: General;  Laterality: N/A;   TUBAL LIGATION     Social History   Socioeconomic History   Marital status: Married    Spouse name: Not on file   Number of children: Not on file   Years of education: Not on file   Highest education level: Not on file  Occupational History   Not on file  Tobacco Use   Smoking status: Former    Current packs/day: 0.00    Types: Cigarettes    Quit date: 02/11/2012    Years since quitting: 11.7   Smokeless tobacco: Never  Vaping Use   Vaping status: Every Day   Substances: Nicotine  Substance and Sexual Activity   Alcohol use: No   Drug use: No   Sexual activity: Not Currently  Other Topics Concern   Not on file  Social History Narrative   ** Merged History Encounter **       Social Drivers of Health   Financial Resource Strain: Not on file  Food Insecurity: Not on file  Transportation Needs: Not on file  Physical Activity: Not on file  Stress: Not on file  Social Connections: Not on file   Outpatient Encounter Medications as of 10/25/2023  Medication Sig   lisinopril  (ZESTRIL ) 20 MG tablet TAKE 1 TABLET(20 MG) BY MOUTH DAILY   [DISCONTINUED] levothyroxine  (SYNTHROID ) 137 MCG tablet TAKE 1 TABLET(137 MCG) BY MOUTH DAILY BEFORE  BREAKFAST   levothyroxine  (SYNTHROID ) 150 MCG tablet Take 1 tablet (150 mcg total) by mouth daily before breakfast. TAKE 1 TABLET(137 MCG) BY MOUTH DAILY BEFORE BREAKFAST   No facility-administered encounter medications on file as of 10/25/2023.   ALLERGIES: Allergies  Allergen Reactions   Hydrocodone Nausea And Vomiting   Hydrocodone    Oxycodone  Nausea And Vomiting   Vicodin [Hydrocodone-Acetaminophen ] Nausea And Vomiting   Vicodin [Hydrocodone-Acetaminophen ]    VACCINATION STATUS: Immunization History  Administered Date(s) Administered   Tdap 09/21/2016     HPI  Thyroid  Problem Presents for follow-up (diagnosed  with hypothyroidism at approximate age of 52 years which was recently confirmed to be related to her Hashimoto's thyroiditis.  She has + FH of thyroid  disorders in: Her mother who is taking thyroid  hormone supplements. No FH of thyroid  cancer. ) visit. Symptoms include fatigue and weight gain. Patient reports no anxiety, cold intolerance, constipation, depressed mood, diarrhea, hair loss, heat intolerance, leg swelling, palpitations,  tremors or weight loss. The symptoms have been stable.    April Pennington is a patient with the above medical history.   She is currently on Levothyroxine  137 mcg p.o. daily before breakfast.    She denies feeling nodules in neck, hoarseness, dysphagia/odynophagia, SOB with lying down.   Review of systems  Constitutional: + increasing body weight,  current Body mass index is 30.07 kg/m. , + fatigue, no subjective hyperthermia, no subjective hypothermia Eyes: no blurry vision, no xerophthalmia ENT: no sore throat, no nodules palpated in throat, no dysphagia/odynophagia, no hoarseness Cardiovascular: no chest pain, no shortness of breath, no palpitations, no leg swelling Respiratory: no cough, no shortness of breath Gastrointestinal: no nausea/vomiting/diarrhea Musculoskeletal: no muscle/joint aches Skin: no rashes, no  hyperemia Neurological: no tremors, no numbness, no tingling, no dizziness Psychiatric: no depression, no anxiety, + increased stress recently  ------------------------------------------------------------------------------------------------------------------------------  OBJECTIVE:  BP 128/88 (BP Location: Right Arm, Cuff Size: Large)   Pulse 78   Ht 5\' 9"  (1.753 m)   Wt 203 lb 9.6 oz (92.4 kg)   BMI 30.07 kg/m  Wt Readings from Last 3 Encounters:  10/25/23 203 lb 9.6 oz (92.4 kg)  04/18/23 196 lb (88.9 kg)  10/23/22 208 lb 6.4 oz (94.5 kg)   BP Readings from Last 3 Encounters:  10/25/23 128/88  04/18/23 124/88  10/23/22 114/74     Physical Exam- Limited  Constitutional:  Body mass index is 30.07 kg/m. , not in acute distress, normal state of mind Eyes:  EOMI, no exophthalmos Musculoskeletal: no gross deformities, strength intact in all four extremities, no gross restriction of joint movements Skin:  no rashes, no hyperemia Neurological: no tremor with outstretched hands    CMP     Component Value Date/Time   NA 140 04/05/2023 0937   K 4.2 04/05/2023 0937   CL 105 04/05/2023 0937   CO2 21 04/05/2023 0937   GLUCOSE 86 04/05/2023 0937   GLUCOSE 84 03/19/2019 0927   BUN 10 04/05/2023 0937   CREATININE 0.93 04/05/2023 0937   CREATININE 0.92 03/19/2019 0927   CALCIUM 9.3 04/05/2023 0937   PROT 6.9 04/05/2023 0937   ALBUMIN 4.3 04/05/2023 0937   AST 16 04/05/2023 0937   ALT 21 04/05/2023 0937   ALKPHOS 84 04/05/2023 0937   BILITOT 0.2 04/05/2023 0937   GFRNONAA 70 03/19/2019 0927   GFRAA 81 03/19/2019 0927    Lab Results  Component Value Date   TSH 8.660 (H) 10/10/2023   TSH 3.050 04/05/2023   TSH 1.490 10/17/2022   TSH 5.260 (H) 06/16/2022   TSH 1.390 01/31/2022   TSH 0.917 10/24/2021   TSH 2.160 04/26/2021   TSH 0.400 (L) 10/12/2020   TSH 2.48 04/08/2020   TSH 2.13 10/06/2019   FREET4 1.29 10/10/2023   FREET4 1.70 04/05/2023   FREET4 1.70  10/17/2022   FREET4 1.64 06/16/2022   FREET4 1.43 01/31/2022   FREET4 1.73 10/24/2021   FREET4 1.20 04/26/2021   FREET4 1.32 10/12/2020   FREET4 1.2 04/08/2020   FREET4 1.2 10/06/2019      Right lobe of her thyroid  is 4.6 cm, left lobe is 1.5 cm.  No discrete nodules on bilateral thyroid  lobes.    Latest Reference Range & Units 10/24/21 10:56 01/31/22 08:32 06/16/22 10:49 10/17/22 08:48 04/05/23 09:37 10/10/23 08:02  TSH 0.450 - 4.500 uIU/mL 0.917 1.390 5.260 (H) 1.490 3.050 8.660 (H)  T4,Free(Direct) 0.82 - 1.77 ng/dL 1.91 4.78 2.95 6.21 3.08 1.29  (H): Data is  abnormally high  ----------------------------------------------------------------------------------------------------------------------------------  ASSESSMENT / PLAN: 1. Hypothyroidism due to Hashimoto's thyroiditis -Her previsit labs are consistent with slight under- replacement and she does have symptoms of such.  She is advised to increase her Levothyroxine  to 150 mcg po daily before breakfast. .   - We discussed about the correct intake of her thyroid  hormone, on empty stomach at fasting, with water, separated by at least 30 minutes from breakfast and other medications,  and separated by more than 4 hours from calcium, iron, multivitamins, acid reflux medications (PPIs). -Patient is made aware of the fact that thyroid  hormone replacement is needed for life, dose to be adjusted by periodic monitoring of thyroid  function tests.  2.  Asymmetric thyroid  -Due to palpable thyromegaly, she had baseline thyroid  ultrasound on 04/01/2018.  Her thyroid  ultrasound is remarkable for asymmetric thyroid  with no discrete nodular lesions. No need for dedicated follow up at this time.  3. Hypertension Her blood pressure is at target today.  She is advised to continue Lisinopril  20 mg po daily with breakfast.  4. Hyperlipidemia Her recent lipid panel from 04/05/23 shows uncontrolled LDL of 121 (improving).  She is not currently on any  lipid lowering medications.  She does not eat dairy or eggs in excess but does eat quite a bit of red meat.  We discussed recommended dietary changes and increasing exercise to avoid needing medications in the future.    5. Vitamin D  deficiency: Her recent vitamin D  level was 52.6 on 04/05/23, stable.  She does take OTC Vitamin D  3 5000 units daily- does skip some days.       I spent  14  minutes in the care of the patient today including review of labs from Thyroid  Function, CMP, and other relevant labs ; imaging/biopsy records (current and previous including abstractions from other facilities); face-to-face time discussing  her lab results and symptoms, medications doses, her options of short and long term treatment based on the latest standards of care / guidelines;   and documenting the encounter.  Lavenia Post  participated in the discussions, expressed understanding, and voiced agreement with the above plans.  All questions were answered to her satisfaction. she is encouraged to contact clinic should she have any questions or concerns prior to her return visit.   Follow Up Plan: Return labs in 4 months, will call with results, for Thyroid  follow up, Previsit labs.     Hulon Magic, [facility] [address] [place], [place] [address] Phone: [phone] Fax: [phone]   10/25/2023, 10:27 AM

## 2024-02-26 DIAGNOSIS — E063 Autoimmune thyroiditis: Secondary | ICD-10-CM | POA: Diagnosis not present

## 2024-02-27 ENCOUNTER — Other Ambulatory Visit: Payer: Self-pay | Admitting: Nurse Practitioner

## 2024-02-27 ENCOUNTER — Ambulatory Visit: Payer: Self-pay | Admitting: Nurse Practitioner

## 2024-02-27 DIAGNOSIS — E063 Autoimmune thyroiditis: Secondary | ICD-10-CM

## 2024-02-27 LAB — TSH: TSH: 0.955 u[IU]/mL (ref 0.450–4.500)

## 2024-02-27 LAB — T4, FREE: Free T4: 1.54 ng/dL (ref 0.82–1.77)

## 2024-02-27 MED ORDER — LEVOTHYROXINE SODIUM 150 MCG PO TABS: 150.0000 ug | ORAL_TABLET | Freq: Every day | ORAL | 1 refills | Status: DC

## 2024-02-27 NOTE — Progress Notes (Signed)
 Please schedule follow up in 4 months with previsit labs.

## 2024-06-25 LAB — T4, FREE: Free T4: 1.63 ng/dL (ref 0.82–1.77)

## 2024-06-25 LAB — TSH: TSH: 1.57 u[IU]/mL (ref 0.450–4.500)

## 2024-06-30 ENCOUNTER — Encounter: Payer: Self-pay | Admitting: Nurse Practitioner

## 2024-06-30 ENCOUNTER — Ambulatory Visit: Admitting: Nurse Practitioner

## 2024-06-30 VITALS — BP 122/84 | HR 70 | Ht 69.0 in | Wt 206.6 lb

## 2024-06-30 DIAGNOSIS — I10 Essential (primary) hypertension: Secondary | ICD-10-CM

## 2024-06-30 DIAGNOSIS — E063 Autoimmune thyroiditis: Secondary | ICD-10-CM

## 2024-06-30 DIAGNOSIS — E559 Vitamin D deficiency, unspecified: Secondary | ICD-10-CM

## 2024-06-30 DIAGNOSIS — E7800 Pure hypercholesterolemia, unspecified: Secondary | ICD-10-CM | POA: Diagnosis not present

## 2024-06-30 MED ORDER — LISINOPRIL 20 MG PO TABS: ORAL_TABLET | ORAL | 3 refills | Status: AC

## 2024-06-30 MED ORDER — LEVOTHYROXINE SODIUM 150 MCG PO TABS: 150.0000 ug | ORAL_TABLET | Freq: Every day | ORAL | 3 refills | Status: AC

## 2024-06-30 NOTE — Patient Instructions (Signed)

## 2024-06-30 NOTE — Progress Notes (Signed)
 "                                                                            06/30/2024, 11:17 AM           Endocrinology follow-up note   SUBJECTIVE:  April Pennington is a 64 y.o.-year-old female patient being engaged for follow-up for hypothyroidism related to Hashimoto's thyroiditis, vitamin D  deficiency.   Past Medical History:  Diagnosis Date   Gallstones    GERD (gastroesophageal reflux disease)    uses Alka seltzer   Past Surgical History:  Procedure Laterality Date   BREAST SURGERY     cyst removed from right breast at age 84   CHOLECYSTECTOMY N/A 05/20/2013   Procedure: LAPAROSCOPIC CHOLECYSTECTOMY WITH INTRAOPERATIVE CHOLANGIOGRAM;  Surgeon: Lynda Leos, MD;  Location: [facility] OR;  Service: General;  Laterality: N/A;   TUBAL LIGATION     Social History   Socioeconomic History   Marital status: Married    Spouse name: Not on file   Number of children: Not on file   Years of education: Not on file   Highest education level: Not on file  Occupational History   Not on file  Tobacco Use   Smoking status: Former    Current packs/day: 0.00    Types: Cigarettes    Quit date: 02/11/2012    Years since quitting: 12.3   Smokeless tobacco: Never  Vaping Use   Vaping status: Every Day   Substances: Nicotine  Substance and Sexual Activity   Alcohol use: No   Drug use: No   Sexual activity: Not Currently  Other Topics Concern   Not on file  Social History Narrative   ** Merged History Encounter **       Social Drivers of Health   Tobacco Use: Medium Risk (06/30/2024)   Patient History    Smoking Tobacco Use: Former    Smokeless Tobacco Use: Never    Passive Exposure: Not on Actuary Strain: Not on file  Food Insecurity: Not on file  Transportation Needs: Not on file  Physical Activity: Not on file  Stress: Not on file  Social Connections: Not on file  Depression (EYV7-0): Not on file  Alcohol Screen: Not on file  Housing: Not on file   Utilities: Not on file  Health Literacy: Not on file   Outpatient Encounter Medications as of 06/30/2024  Medication Sig   Vitamin D -Vitamin K (VITAMIN K2-VITAMIN D3 PO) Take 1 tablet by mouth daily.   [DISCONTINUED] levothyroxine  (SYNTHROID ) 150 MCG tablet Take 1 tablet (150 mcg total) by mouth daily before breakfast. TAKE 1 TABLET(137 MCG) BY MOUTH DAILY BEFORE BREAKFAST   [DISCONTINUED] lisinopril  (ZESTRIL ) 20 MG tablet TAKE 1 TABLET(20 MG) BY MOUTH DAILY   levothyroxine  (SYNTHROID ) 150 MCG tablet Take 1 tablet (150 mcg total) by mouth daily before breakfast. TAKE 1 TABLET(137 MCG) BY MOUTH DAILY BEFORE BREAKFAST   lisinopril  (ZESTRIL ) 20 MG tablet TAKE 1 TABLET(20 MG) BY MOUTH DAILY   No facility-administered encounter medications on file as of 06/30/2024.   ALLERGIES: Allergies  Allergen Reactions   Hydrocodone Nausea And Vomiting   Hydrocodone    Oxycodone  Nausea And Vomiting   Vicodin [  Hydrocodone-Acetaminophen ] Nausea And Vomiting   Vicodin [Hydrocodone-Acetaminophen ]    VACCINATION STATUS: Immunization History  Administered Date(s) Administered   Tdap 09/21/2016     HPI  Thyroid  Problem Presents for follow-up (diagnosed  with hypothyroidism at approximate age of 52 years which was recently confirmed to be related to her Hashimoto's thyroiditis.  She has + FH of thyroid  disorders in: Her mother who is taking thyroid  hormone supplements. No FH of thyroid  cancer. ) visit. Symptoms include fatigue and weight gain. Patient reports no anxiety, cold intolerance, constipation, depressed mood, diarrhea, hair loss, heat intolerance, leg swelling, palpitations, tremors or weight loss. The symptoms have been stable.    April Pennington is a patient with the above medical history.   She is currently on Levothyroxine  137 mcg p.o. daily before breakfast.    She denies feeling nodules in neck, hoarseness, dysphagia/odynophagia, SOB with lying down.   Review of systems  Constitutional:  + increasing body weight,  current Body mass index is 30.51 kg/m. , + fatigue, no subjective hyperthermia, no subjective hypothermia Eyes: no blurry vision, no xerophthalmia ENT: no sore throat, no nodules palpated in throat, no dysphagia/odynophagia, no hoarseness Cardiovascular: no chest pain, no shortness of breath, no palpitations, no leg swelling Respiratory: no cough, no shortness of breath Gastrointestinal: no nausea/vomiting/diarrhea Musculoskeletal: no muscle/joint aches Skin: no rashes, no hyperemia Neurological: no tremors, no numbness, no tingling, no dizziness Psychiatric: no depression, no anxiety, + increased stress recently  ------------------------------------------------------------------------------------------------------------------------------  OBJECTIVE:  BP 122/84 (BP Location: Left Arm, Patient Position: Sitting, Cuff Size: Large)   Pulse 70   Ht 5' 9 (1.753 m)   Wt 206 lb 9.6 oz (93.7 kg)   BMI 30.51 kg/m  Wt Readings from Last 3 Encounters:  06/30/24 206 lb 9.6 oz (93.7 kg)  10/25/23 203 lb 9.6 oz (92.4 kg)  04/18/23 196 lb (88.9 kg)   BP Readings from Last 3 Encounters:  06/30/24 122/84  10/25/23 128/88  04/18/23 124/88     Physical Exam- Limited  Constitutional:  Body mass index is 30.51 kg/m. , not in acute distress, normal state of mind Eyes:  EOMI, no exophthalmos Musculoskeletal: no gross deformities, strength intact in all four extremities, no gross restriction of joint movements Skin:  no rashes, no hyperemia Neurological: no tremor with outstretched hands    CMP     Component Value Date/Time   NA 140 04/05/2023 0937   K 4.2 04/05/2023 0937   CL 105 04/05/2023 0937   CO2 21 04/05/2023 0937   GLUCOSE 86 04/05/2023 0937   GLUCOSE 84 03/19/2019 0927   BUN 10 04/05/2023 0937   CREATININE 0.93 04/05/2023 0937   CREATININE 0.92 03/19/2019 0927   CALCIUM 9.3 04/05/2023 0937   PROT 6.9 04/05/2023 0937   ALBUMIN 4.3 04/05/2023 0937    AST 16 04/05/2023 0937   ALT 21 04/05/2023 0937   ALKPHOS 84 04/05/2023 0937   BILITOT 0.2 04/05/2023 0937   GFRNONAA 70 03/19/2019 0927   GFRAA 81 03/19/2019 0927    Lab Results  Component Value Date   TSH 1.570 06/24/2024   TSH 0.955 02/26/2024   TSH 8.660 (H) 10/10/2023   TSH 3.050 04/05/2023   TSH 1.490 10/17/2022   TSH 5.260 (H) 06/16/2022   TSH 1.390 01/31/2022   TSH 0.917 10/24/2021   TSH 2.160 04/26/2021   TSH 0.400 (L) 10/12/2020   FREET4 1.63 06/24/2024   FREET4 1.54 02/26/2024   FREET4 1.29 10/10/2023   FREET4 1.70 04/05/2023  FREET4 1.70 10/17/2022   FREET4 1.64 06/16/2022   FREET4 1.43 01/31/2022   FREET4 1.73 10/24/2021   FREET4 1.20 04/26/2021   FREET4 1.32 10/12/2020      Right lobe of her thyroid  is 4.6 cm, left lobe is 1.5 cm.  No discrete nodules on bilateral thyroid  lobes.    Latest Reference Range & Units 10/17/22 08:48 04/05/23 09:37 10/10/23 08:02 02/26/24 13:13 06/24/24 09:29  TSH 0.450 - 4.500 uIU/mL 1.490 3.050 8.660 (H) 0.955 1.570  T4,Free(Direct) 0.82 - 1.77 ng/dL 8.29 8.29 8.70 8.45 8.36  (H): Data is abnormally high  ----------------------------------------------------------------------------------------------------------------------------------  ASSESSMENT / PLAN: 1. Hypothyroidism due to Hashimoto's thyroiditis -Her previsit labs are consistent with appropriate hormone replacement.  She is advised to continue her Levothyroxine  150 mcg po daily before breakfast. .   - We discussed about the correct intake of her thyroid  hormone, on empty stomach at fasting, with water, separated by at least 30 minutes from breakfast and other medications,  and separated by more than 4 hours from calcium, iron, multivitamins, acid reflux medications (PPIs). -Patient is made aware of the fact that thyroid  hormone replacement is needed for life, dose to be adjusted by periodic monitoring of thyroid  function tests.  2.  Asymmetric thyroid  -Due to  palpable thyromegaly, she had baseline thyroid  ultrasound on 04/01/2018.  Her thyroid  ultrasound is remarkable for asymmetric thyroid  with no discrete nodular lesions. No need for dedicated follow up at this time.  3. Hypertension Her blood pressure is at target today.  She is advised to continue Lisinopril  20 mg po daily with breakfast- refills sent today.  4. Hyperlipidemia Her recent lipid panel from 04/05/23 shows uncontrolled LDL of 121 (improving).  She is not currently on any lipid lowering medications.  She does not eat dairy or eggs in excess but does eat quite a bit of red meat.  We discussed recommended dietary changes and increasing exercise to avoid needing medications in the future.    5. Vitamin D  deficiency: Her recent vitamin D  level was 52.6 on 04/05/23, stable.  She does take OTC Vitamin D  3 5000-K2 supplement.  Will recheck prior to next visit.    I spent  16  minutes in the care of the patient today including review of labs from Thyroid  Function, CMP, and other relevant labs ; imaging/biopsy records (current and previous including abstractions from other facilities); face-to-face time discussing  her lab results and symptoms, medications doses, her options of short and long term treatment based on the latest standards of care / guidelines;   and documenting the encounter.  Kate JONETTA Bihari  participated in the discussions, expressed understanding, and voiced agreement with the above plans.  All questions were answered to her satisfaction. she is encouraged to contact clinic should she have any questions or concerns prior to her return visit.   Follow Up Plan: Return in about 6 months (around 12/28/2024) for Thyroid  follow up, Previsit labs.     Benton Rio, [facility] [address] [place], [place] [address] Phone: [phone] Fax: [phone]   06/30/2024, 11:17 AM   "

## 2024-12-29 ENCOUNTER — Ambulatory Visit: Admitting: Nurse Practitioner
